# Patient Record
Sex: Female | Born: 1975 | Race: Black or African American | Hispanic: No | Smoking: Never smoker
Health system: Southern US, Community
[De-identification: ages and names within clinical notes are randomized; demographics above are authoritative.]

## PROBLEM LIST (undated history)

## (undated) DIAGNOSIS — R55 Syncope and collapse: Secondary | ICD-10-CM

## (undated) DIAGNOSIS — D573 Sickle-cell trait: Secondary | ICD-10-CM

---

## 1997-10-25 ENCOUNTER — Inpatient Hospital Stay (HOSPITAL_COMMUNITY): Admission: AD | Admit: 1997-10-25 | Discharge: 1997-10-25 | Payer: Self-pay | Admitting: *Deleted

## 1998-05-12 ENCOUNTER — Inpatient Hospital Stay (HOSPITAL_COMMUNITY): Admission: AD | Admit: 1998-05-12 | Discharge: 1998-05-12 | Payer: Self-pay | Admitting: Obstetrics

## 1998-05-22 ENCOUNTER — Emergency Department (HOSPITAL_COMMUNITY): Admission: EM | Admit: 1998-05-22 | Discharge: 1998-05-22 | Payer: Self-pay | Admitting: Emergency Medicine

## 1998-05-22 ENCOUNTER — Encounter: Payer: Self-pay | Admitting: Emergency Medicine

## 1998-06-07 ENCOUNTER — Ambulatory Visit (HOSPITAL_COMMUNITY): Admission: RE | Admit: 1998-06-07 | Discharge: 1998-06-07 | Payer: Self-pay | Admitting: Obstetrics

## 1998-07-17 ENCOUNTER — Inpatient Hospital Stay (HOSPITAL_COMMUNITY): Admission: AD | Admit: 1998-07-17 | Discharge: 1998-07-17 | Payer: Self-pay | Admitting: *Deleted

## 1998-08-18 ENCOUNTER — Ambulatory Visit (HOSPITAL_COMMUNITY): Admission: RE | Admit: 1998-08-18 | Discharge: 1998-08-18 | Payer: Self-pay | Admitting: *Deleted

## 1998-10-13 ENCOUNTER — Ambulatory Visit (HOSPITAL_COMMUNITY): Admission: RE | Admit: 1998-10-13 | Discharge: 1998-10-13 | Payer: Self-pay | Admitting: *Deleted

## 1998-10-13 ENCOUNTER — Encounter: Payer: Self-pay | Admitting: *Deleted

## 1998-11-28 ENCOUNTER — Inpatient Hospital Stay (HOSPITAL_COMMUNITY): Admission: AD | Admit: 1998-11-28 | Discharge: 1998-11-28 | Payer: Self-pay | Admitting: *Deleted

## 1998-12-05 ENCOUNTER — Inpatient Hospital Stay (HOSPITAL_COMMUNITY): Admission: AD | Admit: 1998-12-05 | Discharge: 1998-12-07 | Payer: Self-pay | Admitting: *Deleted

## 1998-12-12 ENCOUNTER — Inpatient Hospital Stay (HOSPITAL_COMMUNITY): Admission: AD | Admit: 1998-12-12 | Discharge: 1998-12-13 | Payer: Self-pay | Admitting: *Deleted

## 1999-11-10 ENCOUNTER — Inpatient Hospital Stay (HOSPITAL_COMMUNITY): Admission: EM | Admit: 1999-11-10 | Discharge: 1999-11-10 | Payer: Self-pay | Admitting: Obstetrics & Gynecology

## 1999-11-24 ENCOUNTER — Emergency Department (HOSPITAL_COMMUNITY): Admission: EM | Admit: 1999-11-24 | Discharge: 1999-11-24 | Payer: Self-pay | Admitting: Emergency Medicine

## 1999-12-18 ENCOUNTER — Inpatient Hospital Stay (HOSPITAL_COMMUNITY): Admission: AD | Admit: 1999-12-18 | Discharge: 1999-12-18 | Payer: Self-pay | Admitting: *Deleted

## 1999-12-28 ENCOUNTER — Inpatient Hospital Stay (HOSPITAL_COMMUNITY): Admission: AD | Admit: 1999-12-28 | Discharge: 1999-12-28 | Payer: Self-pay | Admitting: *Deleted

## 2000-03-21 ENCOUNTER — Inpatient Hospital Stay (HOSPITAL_COMMUNITY): Admission: AD | Admit: 2000-03-21 | Discharge: 2000-03-21 | Payer: Self-pay | Admitting: *Deleted

## 2000-04-09 ENCOUNTER — Inpatient Hospital Stay (HOSPITAL_COMMUNITY): Admission: AD | Admit: 2000-04-09 | Discharge: 2000-04-09 | Payer: Self-pay | Admitting: *Deleted

## 2000-04-26 ENCOUNTER — Encounter: Payer: Self-pay | Admitting: *Deleted

## 2000-04-26 ENCOUNTER — Ambulatory Visit (HOSPITAL_COMMUNITY): Admission: RE | Admit: 2000-04-26 | Discharge: 2000-04-26 | Payer: Self-pay | Admitting: *Deleted

## 2000-05-12 ENCOUNTER — Inpatient Hospital Stay (HOSPITAL_COMMUNITY): Admission: AD | Admit: 2000-05-12 | Discharge: 2000-05-12 | Payer: Self-pay | Admitting: *Deleted

## 2000-06-14 ENCOUNTER — Ambulatory Visit (HOSPITAL_COMMUNITY): Admission: RE | Admit: 2000-06-14 | Discharge: 2000-06-14 | Payer: Self-pay | Admitting: *Deleted

## 2000-06-14 ENCOUNTER — Encounter: Payer: Self-pay | Admitting: *Deleted

## 2000-09-26 ENCOUNTER — Ambulatory Visit (HOSPITAL_COMMUNITY): Admission: RE | Admit: 2000-09-26 | Discharge: 2000-09-26 | Payer: Self-pay | Admitting: Obstetrics

## 2000-09-26 ENCOUNTER — Encounter: Payer: Self-pay | Admitting: *Deleted

## 2000-10-21 ENCOUNTER — Inpatient Hospital Stay (HOSPITAL_COMMUNITY): Admission: AD | Admit: 2000-10-21 | Discharge: 2000-10-24 | Payer: Self-pay | Admitting: *Deleted

## 2001-01-31 ENCOUNTER — Inpatient Hospital Stay (HOSPITAL_COMMUNITY): Admission: AD | Admit: 2001-01-31 | Discharge: 2001-01-31 | Payer: Self-pay | Admitting: *Deleted

## 2001-02-11 ENCOUNTER — Other Ambulatory Visit: Admission: RE | Admit: 2001-02-11 | Discharge: 2001-02-11 | Payer: Self-pay | Admitting: Obstetrics and Gynecology

## 2001-03-06 ENCOUNTER — Inpatient Hospital Stay (HOSPITAL_COMMUNITY): Admission: AD | Admit: 2001-03-06 | Discharge: 2001-03-06 | Payer: Self-pay | Admitting: Obstetrics

## 2002-12-10 ENCOUNTER — Encounter: Payer: Self-pay | Admitting: Obstetrics and Gynecology

## 2002-12-10 ENCOUNTER — Inpatient Hospital Stay (HOSPITAL_COMMUNITY): Admission: AD | Admit: 2002-12-10 | Discharge: 2002-12-10 | Payer: Self-pay | Admitting: Obstetrics and Gynecology

## 2002-12-19 ENCOUNTER — Other Ambulatory Visit: Admission: RE | Admit: 2002-12-19 | Discharge: 2002-12-19 | Payer: Self-pay | Admitting: Obstetrics and Gynecology

## 2010-03-07 ENCOUNTER — Emergency Department (HOSPITAL_COMMUNITY): Admission: EM | Admit: 2010-03-07 | Discharge: 2010-03-08 | Payer: Self-pay | Admitting: Emergency Medicine

## 2010-09-23 LAB — URINALYSIS, ROUTINE W REFLEX MICROSCOPIC
Bilirubin Urine: NEGATIVE
Glucose, UA: NEGATIVE mg/dL
Ketones, ur: NEGATIVE mg/dL
Nitrite: POSITIVE — AB
Protein, ur: 30 mg/dL — AB
Specific Gravity, Urine: 1.009 (ref 1.005–1.030)
Urobilinogen, UA: 1 mg/dL (ref 0.0–1.0)
pH: 6 (ref 5.0–8.0)

## 2010-09-23 LAB — CBC
HCT: 34.8 % — ABNORMAL LOW (ref 36.0–46.0)
Hemoglobin: 11.2 g/dL — ABNORMAL LOW (ref 12.0–15.0)
MCH: 26.2 pg (ref 26.0–34.0)
MCHC: 32.2 g/dL (ref 30.0–36.0)
MCV: 81.5 fL (ref 78.0–100.0)
Platelets: 256 10*3/uL (ref 150–400)
RBC: 4.27 MIL/uL (ref 3.87–5.11)
RDW: 14.2 % (ref 11.5–15.5)
WBC: 9.1 10*3/uL (ref 4.0–10.5)

## 2010-09-23 LAB — DIFFERENTIAL
Basophils Absolute: 0 10*3/uL (ref 0.0–0.1)
Basophils Relative: 0 % (ref 0–1)
Eosinophils Absolute: 0.1 10*3/uL (ref 0.0–0.7)
Eosinophils Relative: 1 % (ref 0–5)
Lymphocytes Relative: 18 % (ref 12–46)
Lymphs Abs: 1.7 K/uL (ref 0.7–4.0)
Monocytes Absolute: 0.7 K/uL (ref 0.1–1.0)
Monocytes Relative: 7 % (ref 3–12)
Neutro Abs: 6.6 K/uL (ref 1.7–7.7)
Neutrophils Relative %: 73 % (ref 43–77)

## 2010-09-23 LAB — BASIC METABOLIC PANEL WITH GFR
Calcium: 9 mg/dL (ref 8.4–10.5)
GFR calc Af Amer: >60 mL/min (ref >60)
GFR calc non Af Amer: >60 mL/min (ref >60)
Glucose, Bld: 95 mg/dL (ref 70–99)
Potassium: 3.7 meq/L (ref 3.5–5.1)
Sodium: 138 meq/L (ref 135–145)

## 2010-09-23 LAB — WET PREP, GENITAL: Yeast Wet Prep HPF POC: NONE SEEN

## 2010-09-23 LAB — URINE CULTURE
Colony Count: >=100000
Culture  Setup Time: 201108292225

## 2010-09-23 LAB — BASIC METABOLIC PANEL
BUN: 9 mg/dL (ref 6–23)
CO2: 25 mEq/L (ref 19–32)
Chloride: 107 mEq/L (ref 96–112)
Creatinine, Ser: 0.83 mg/dL (ref 0.4–1.2)

## 2010-09-23 LAB — GC/CHLAMYDIA PROBE AMP, GENITAL
Chlamydia, DNA Probe: NEGATIVE
GC Probe Amp, Genital: NEGATIVE

## 2010-09-23 LAB — POCT PREGNANCY, URINE: Preg Test, Ur: NEGATIVE

## 2010-09-23 LAB — URINE MICROSCOPIC-ADD ON

## 2010-12-23 ENCOUNTER — Inpatient Hospital Stay (HOSPITAL_COMMUNITY)
Admission: AD | Admit: 2010-12-23 | Discharge: 2010-12-23 | Disposition: A | Discharge status: Home / Self Care | Payer: Self-pay | Source: Ambulatory Visit | Attending: Obstetrics & Gynecology | Admitting: Obstetrics & Gynecology

## 2010-12-23 DIAGNOSIS — A5901 Trichomonal vulvovaginitis: Secondary | ICD-10-CM

## 2010-12-23 DIAGNOSIS — R109 Unspecified abdominal pain: Secondary | ICD-10-CM | POA: Insufficient documentation

## 2010-12-23 DIAGNOSIS — N739 Female pelvic inflammatory disease, unspecified: Secondary | ICD-10-CM

## 2010-12-23 LAB — CBC
Platelets: 280 10*3/uL (ref 150–400)
RBC: 4.47 MIL/uL (ref 3.87–5.11)
WBC: 6.9 10*3/uL (ref 4.0–10.5)

## 2010-12-23 LAB — WET PREP, GENITAL

## 2010-12-23 LAB — HCG, QUANTITATIVE, PREGNANCY: hCG, Beta Chain, Quant, S: 1 m[IU]/mL (ref <5)

## 2010-12-24 LAB — GC/CHLAMYDIA PROBE AMP, GENITAL: Chlamydia, DNA Probe: NEGATIVE

## 2010-12-27 ENCOUNTER — Emergency Department (HOSPITAL_COMMUNITY)
Admission: EM | Admit: 2010-12-27 | Discharge: 2010-12-27 | Disposition: A | Discharge status: Home / Self Care | Payer: Self-pay | Attending: Emergency Medicine | Admitting: Emergency Medicine

## 2010-12-27 ENCOUNTER — Emergency Department (HOSPITAL_COMMUNITY): Payer: Self-pay

## 2010-12-27 DIAGNOSIS — R55 Syncope and collapse: Secondary | ICD-10-CM | POA: Insufficient documentation

## 2010-12-27 DIAGNOSIS — R51 Headache: Secondary | ICD-10-CM | POA: Insufficient documentation

## 2010-12-27 LAB — RAPID URINE DRUG SCREEN, HOSP PERFORMED
Amphetamines: NOT DETECTED
Barbiturates: NOT DETECTED
Cocaine: NOT DETECTED
Tetrahydrocannabinol: NOT DETECTED

## 2010-12-27 LAB — ETHANOL: Alcohol, Ethyl (B): <11 mg/dL (ref 0–11)

## 2010-12-27 LAB — URINALYSIS, ROUTINE W REFLEX MICROSCOPIC
Bilirubin Urine: NEGATIVE
Ketones, ur: NEGATIVE mg/dL
Nitrite: NEGATIVE
pH: 5.5 (ref 5.0–8.0)

## 2011-08-29 ENCOUNTER — Emergency Department (HOSPITAL_COMMUNITY)
Admission: EM | Admit: 2011-08-29 | Discharge: 2011-08-29 | Disposition: A | Discharge status: Home / Self Care | Payer: Self-pay | Attending: Emergency Medicine | Admitting: Emergency Medicine

## 2011-08-29 ENCOUNTER — Encounter (HOSPITAL_COMMUNITY): Payer: Self-pay | Admitting: Emergency Medicine

## 2011-08-29 DIAGNOSIS — G44009 Cluster headache syndrome, unspecified, not intractable: Secondary | ICD-10-CM | POA: Insufficient documentation

## 2011-08-29 DIAGNOSIS — D573 Sickle-cell trait: Secondary | ICD-10-CM | POA: Insufficient documentation

## 2011-08-29 DIAGNOSIS — H53149 Visual discomfort, unspecified: Secondary | ICD-10-CM | POA: Insufficient documentation

## 2011-08-29 DIAGNOSIS — R110 Nausea: Secondary | ICD-10-CM | POA: Insufficient documentation

## 2011-08-29 HISTORY — DX: Sickle-cell trait: D57.3

## 2011-08-29 HISTORY — DX: Syncope and collapse: R55

## 2011-08-29 MED ORDER — METOCLOPRAMIDE HCL 5 MG/ML IJ SOLN
10.0000 mg | Freq: Once | INTRAMUSCULAR | Status: AC
  Administered 2011-08-29: 10 mg via INTRAVENOUS
  Filled 2011-08-29: qty 2

## 2011-08-29 MED ORDER — DEXAMETHASONE SODIUM PHOSPHATE 10 MG/ML IJ SOLN
10.0000 mg | Freq: Once | INTRAMUSCULAR | Status: AC
  Administered 2011-08-29: 10 mg via INTRAVENOUS
  Filled 2011-08-29: qty 1

## 2011-08-29 MED ORDER — SODIUM CHLORIDE 0.9 % IV BOLUS (SEPSIS)
1000.0000 mL | Freq: Once | INTRAVENOUS | Status: AC
  Administered 2011-08-29: 1000 mL via INTRAVENOUS

## 2011-08-29 MED ORDER — DIPHENHYDRAMINE HCL 50 MG/ML IJ SOLN
25.0000 mg | Freq: Once | INTRAMUSCULAR | Status: AC
  Administered 2011-08-29: 12:00:00 via INTRAVENOUS
  Filled 2011-08-29: qty 1

## 2011-08-29 NOTE — Discharge Instructions (Signed)
Cluster Headache Cluster headaches are recognized by their pattern of deep, intense head pain. They normally occur on one side of the head. Typically, cluster headaches:  Are severe in nature.   Occur repeatedly over weeks to months at a time and are then followed by periods of no headaches.   Can last 15 minutes to 3 hours.   Occur at the same time each day, often at night.   Occur several times a day.  CAUSES The exact cause of a cluster headache is not known. Some things can trigger a cluster headache, such as:  Smoking.   Alcohol use.   Lack of sleep.   High altitude travel, such as airline travel.   Change of seasons.   Certain foods, such as foods or drinks that contain nitrates, glutamate, aspartame, or tyramine.  SYMPTOMS   Severe pain that begins in or around the eye or temple.   One-sided head pain.   Feeling sick to your stomach (nauseous).   Sensitivity to light.   Runny nose.   Eye redness, tearing, and nasal stuffiness on the side of your head where you are experiencing pain.   Sweaty, pale skin of the face.   Droopy eyelid.  DIAGNOSIS  Cluster headaches are diagnosed based on symptoms and physical exam. Your caregiver may order a computerized X-ray scan (CT or CAT scan) or a computerized magnetic scan (MRI) of your head or other lab tests to see if your headaches are caused from other medical conditions.  TREATMENT   Medications for pain relief and to prevent recurrent attacks.   Oxygen for pain relief.   Biofeedback programs may help reduce headache pain.  Some people may need a combination of medicines for cluster headaches. It may be helpful to keep a headache diary. This may help you find a trend for what is triggering your headaches. Your caregiver can develop a treatment plan that can be helpful to you.  HOME CARE INSTRUCTIONS  During cluster periods:  Follow a regular sleep schedule.Do not vary the amount and time that you sleep from day  to day. It is important to stay on the same schedule during a cluster period to help prevent headaches.   Avoid alcohol.   Stop smoking.  SEEK IMMEDIATE MEDICAL CARE IF:   You have any changes from your previous cluster headaches either in intensity or frequency.   You are not getting relief from medicines you are taking.   You faint.   You develop weakness or numbness, especially on one side of your body or face.   You develop double vision.   You develop nausea or vomiting.   You cannot keep your balance or have difficulty talking or walking.   You develop neck pain or neck stiffness.   You have a fever.  Document Released: 06/26/2005 Document Revised: 03/08/2011 Document Reviewed: 09/24/2010 [facility] Patient Information 2012 [place], [place].Migraine Headache A migraine headache is an intense, throbbing pain on one or both sides of your head. The exact cause of a migraine headache is not always known. A migraine may be caused when nerves in the brain become irritated and release chemicals that cause swelling within blood vessels, causing pain. Many migraine sufferers have a family history of migraines. Before you get a migraine you may or may not get an aura. An aura is a group of symptoms that can predict the beginning of a migraine. An aura may include:  Visual changes such as:   Flashing lights.  Bright spots or zig-zag lines.   Tunnel vision.   Feelings of numbness.   Trouble talking.   Muscle weakness.  SYMPTOMS  Pain on one or both sides of your head.   Pain that is pulsating or throbbing in nature.   Pain that is severe enough to prevent daily activities.   Pain that is aggravated by any daily physical activity.   Nausea (feeling sick to your stomach), vomiting, or both.   Pain with exposure to bright lights, loud noises, or activity.   General sensitivity to bright lights or loud noises.  MIGRAINE TRIGGERS Examples of triggers of migraine headaches  include:   Alcohol.   Smoking.   Stress.   It may be related to menses (female menstruation).   Aged cheeses.   Foods or drinks that contain nitrates, glutamate, aspartame, or tyramine.   Lack of sleep.   Chocolate.   Caffeine.   Hunger.   Medications such as nitroglycerine (used to treat chest pain), birth control pills, estrogen, and some blood pressure medications.  DIAGNOSIS  A migraine headache is often diagnosed based on:  Symptoms.   Physical examination.   A computerized X-ray scan (computed tomography, CT) of your head.  TREATMENT  Medications can help prevent migraines if they are recurrent or should they become recurrent. Your caregiver can help you with a medication or treatment program that will be helpful to you.   Lying down in a dark, quiet room may be helpful.   Keeping a headache diary may help you find a trend as to what may be triggering your headaches.  SEEK IMMEDIATE MEDICAL CARE IF:   You have confusion, personality changes or seizures.   You have headaches that wake you from sleep.   You have an increased frequency in your headaches.   You have a stiff neck.   You have a loss of vision.   You have muscle weakness.   You start losing your balance or have trouble walking.   You feel faint or pass out.  MAKE SURE YOU:   Understand these instructions.   Will watch your condition.   Will get help right away if you are not doing well or get worse.  Document Released: 06/26/2005 Document Revised: 03/08/2011 Document Reviewed: 02/09/2009 [facility] Patient Information 2012 [place], [place].

## 2011-08-29 NOTE — ED Notes (Signed)
Pt from home c/o constant headache and intermittent nausea and sharp epigastric pain for about a week. Pt denies vomiting and diarrhea or injury.

## 2011-08-29 NOTE — ED Notes (Signed)
MD at bedside. 

## 2011-08-29 NOTE — ED Provider Notes (Signed)
History     CSN: [id number]  Arrival date & time 08/29/11  1038   First MD Initiated Contact with Patient 08/29/11 1044      Chief Complaint  Patient presents with  . Headache    (Consider location/radiation/quality/duration/timing/severity/associated sxs/prior treatment) HPI Patient is a 36 year old female who presents today complaining of headache for the past week. Patient has history of prior headaches. She describes bilateral frontal discomfort. She denies any nasal congestion, neck pain, fevers, rash, or neurologic symptoms. She does describe some mild photophobia. Patient has tried [facility] powders, ibuprofen, and Tylenol without relief. She endorses nausea but no vomiting. Nothing has made her symptoms better and made has made them worse. There are no other associated or modifying factors.  Past Medical History  Diagnosis Date  . Syncope   . Sickle cell trait     Past Surgical History  Procedure Date  . Cesarean section     History reviewed. No pertinent family history.  History  Substance Use Topics  . Smoking status: Never Smoker   . Smokeless tobacco: Never Used  . Alcohol Use: Yes     socially    OB History    Grav Para Term Preterm Abortions TAB SAB Ect Mult Living                  Review of Systems  Constitutional: Negative.   Eyes: Positive for photophobia.  Respiratory: Negative.   Cardiovascular: Negative.   Gastrointestinal: Negative.   Genitourinary: Negative.   Musculoskeletal: Negative.   Skin: Negative.   Neurological: Positive for headaches.  Hematological: Negative.   Psychiatric/Behavioral: Negative.   All other systems reviewed and are negative.    Allergies  Review of patient's allergies indicates no known allergies.  Home Medications   Current Outpatient Rx  Name Route Sig Dispense Refill  . BC HEADACHE POWDER PO Oral Take by mouth every 4 (four) hours as needed.    . IBUPROFEN 200 MG PO TABS Oral Take 400 mg by mouth every 6  (six) hours as needed. For pain/fever      BP 110/67  Pulse 81  Temp(Src) 99.1 F (37.3 C) (Oral)  Resp 18  Wt 189 lb (85.73 kg)  SpO2 100%  LMP 08/06/2011  Physical Exam  Nursing note and vitals reviewed. GEN: Well-developed, well-nourished female in no distress HEENT: Atraumatic, normocephalic. Oropharynx clear without erythema EYES: PERRLA BL, no scleral icterus. NECK: Trachea midline, no meningismus CV: regular rate and rhythm. No murmurs, rubs, or gallops PULM: No respiratory distress.  No crackles, wheezes, or rales. GI: soft, non-tender. No guarding, rebound, or tenderness. + bowel sounds  Neuro: cranial nerves 2-12 intact, no abnormalities of strength or sensation, A and O x 3 MSK: Patient moves all 4 extremities symmetrically, no deformity, edema, or injury noted Psych: no abnormality of mood   ED Course  Procedures (including critical care time)   Labs Reviewed  POCT PREGNANCY, URINE   No results found.   1. Cluster headache       MDM  Patient was evaluated by myself. Based on evaluation patient was treated with Reglan and Benadryl as well as IV fluids. Patient did have a pregnancy test that she was uncertain whether she could be pregnant. This is negative. Patient had complete resolution of her headache. She was given a dose of Decadron to prevent recurrence. Patient was discharged in good condition.        Cyndra Numbers, MD 08/29/11 [phone]

## 2012-02-15 ENCOUNTER — Emergency Department (HOSPITAL_COMMUNITY)
Admission: EM | Admit: 2012-02-15 | Discharge: 2012-02-15 | Disposition: A | Discharge status: Home / Self Care | Payer: Medicaid Other | Attending: Emergency Medicine | Admitting: Emergency Medicine

## 2012-02-15 ENCOUNTER — Emergency Department (HOSPITAL_COMMUNITY): Payer: Medicaid Other

## 2012-02-15 ENCOUNTER — Encounter (HOSPITAL_COMMUNITY): Payer: Self-pay | Admitting: Adult Health

## 2012-02-15 ENCOUNTER — Other Ambulatory Visit: Payer: Self-pay

## 2012-02-15 DIAGNOSIS — R0602 Shortness of breath: Secondary | ICD-10-CM | POA: Insufficient documentation

## 2012-02-15 DIAGNOSIS — R079 Chest pain, unspecified: Secondary | ICD-10-CM | POA: Insufficient documentation

## 2012-02-15 LAB — BASIC METABOLIC PANEL
GFR calc Af Amer: >90 mL/min (ref >90)
GFR calc non Af Amer: >90 mL/min (ref >90)
Glucose, Bld: 113 mg/dL — ABNORMAL HIGH (ref 70–99)
Potassium: 3.8 mEq/L (ref 3.5–5.1)
Sodium: 139 mEq/L (ref 135–145)

## 2012-02-15 LAB — POCT I-STAT TROPONIN I

## 2012-02-15 LAB — CBC
Hemoglobin: 12.9 g/dL (ref 12.0–15.0)
MCHC: 32.2 g/dL (ref 30.0–36.0)
WBC: 7.7 10*3/uL (ref 4.0–10.5)

## 2012-02-15 NOTE — ED Notes (Addendum)
2 days of sharp right sided chest pain that radiates to epigastric area and back associated with pain with inspiration and SOB pain was intermittent but has become constant today. Denies nausea

## 2012-02-15 NOTE — ED Notes (Signed)
Pt did not answer when called

## 2012-02-15 NOTE — ED Notes (Signed)
Called for pt with no answer 

## 2012-02-15 NOTE — ED Notes (Signed)
Pt called for room and no answer  

## 2012-02-16 ENCOUNTER — Encounter (HOSPITAL_COMMUNITY): Payer: Self-pay | Admitting: *Deleted

## 2012-02-16 ENCOUNTER — Inpatient Hospital Stay (HOSPITAL_COMMUNITY)
Admission: EM | Admit: 2012-02-16 | Discharge: 2012-02-18 | DRG: 419 | Disposition: A | Discharge status: Home / Self Care | Payer: Medicaid Other | Attending: Surgery | Admitting: Surgery

## 2012-02-16 ENCOUNTER — Emergency Department (HOSPITAL_COMMUNITY): Payer: Medicaid Other

## 2012-02-16 DIAGNOSIS — D573 Sickle-cell trait: Secondary | ICD-10-CM | POA: Diagnosis present

## 2012-02-16 DIAGNOSIS — K810 Acute cholecystitis: Principal | ICD-10-CM | POA: Diagnosis present

## 2012-02-16 DIAGNOSIS — K8010 Calculus of gallbladder with chronic cholecystitis without obstruction: Secondary | ICD-10-CM

## 2012-02-16 DIAGNOSIS — K819 Cholecystitis, unspecified: Secondary | ICD-10-CM

## 2012-02-16 LAB — CBC WITH DIFFERENTIAL/PLATELET
Basophils Absolute: 0.1 10*3/uL (ref 0.0–0.1)
HCT: 40.2 % (ref 36.0–46.0)
Lymphocytes Relative: 28 % (ref 12–46)
Neutro Abs: 4.3 10*3/uL (ref 1.7–7.7)
Platelets: 294 10*3/uL (ref 150–400)
RDW: 14.8 % (ref 11.5–15.5)
WBC: 6.9 10*3/uL (ref 4.0–10.5)

## 2012-02-16 LAB — COMPREHENSIVE METABOLIC PANEL
ALT: 491 U/L — ABNORMAL HIGH (ref 0–35)
AST: 292 U/L — ABNORMAL HIGH (ref 0–37)
CO2: 23 mEq/L (ref 19–32)
Chloride: 104 mEq/L (ref 96–112)
GFR calc non Af Amer: >90 mL/min (ref >90)
Potassium: 3.7 mEq/L (ref 3.5–5.1)
Sodium: 139 mEq/L (ref 135–145)
Total Bilirubin: 0.9 mg/dL (ref 0.3–1.2)

## 2012-02-16 LAB — URINALYSIS, ROUTINE W REFLEX MICROSCOPIC
Hgb urine dipstick: NEGATIVE
Protein, ur: NEGATIVE mg/dL
Urobilinogen, UA: 1 mg/dL (ref 0.0–1.0)

## 2012-02-16 LAB — LIPASE, BLOOD: Lipase: 32 U/L (ref 11–59)

## 2012-02-16 MED ORDER — ACETAMINOPHEN 650 MG RE SUPP: 650.0000 mg | Freq: Four times a day (QID) | RECTAL | Status: DC | PRN

## 2012-02-16 MED ORDER — HYDROMORPHONE HCL PF 1 MG/ML IJ SOLN
1.0000 mg | INTRAMUSCULAR | Status: DC | PRN
  Administered 2012-02-17 (×2): 1 mg via INTRAVENOUS
  Filled 2012-02-16 (×3): qty 1

## 2012-02-16 MED ORDER — OXYCODONE-ACETAMINOPHEN 5-325 MG PO TABS
2.0000 | ORAL_TABLET | Freq: Once | ORAL | Status: AC
  Administered 2012-02-16: 2 via ORAL
  Filled 2012-02-16: qty 2

## 2012-02-16 MED ORDER — POTASSIUM CHLORIDE IN NACL 20-0.9 MEQ/L-% IV SOLN
INTRAVENOUS | Status: DC
  Administered 2012-02-16: 23:00:00 via INTRAVENOUS
  Filled 2012-02-16 (×3): qty 1000

## 2012-02-16 MED ORDER — ONDANSETRON HCL 4 MG/2ML IJ SOLN: 4.0000 mg | Freq: Four times a day (QID) | INTRAMUSCULAR | Status: DC | PRN

## 2012-02-16 MED ORDER — PANTOPRAZOLE SODIUM 40 MG IV SOLR
40.0000 mg | Freq: Every day | INTRAVENOUS | Status: DC
  Administered 2012-02-16: 40 mg via INTRAVENOUS
  Filled 2012-02-16 (×2): qty 40

## 2012-02-16 MED ORDER — SODIUM CHLORIDE 0.9 % IV SOLN
3.0000 g | Freq: Four times a day (QID) | INTRAVENOUS | Status: DC
  Administered 2012-02-16 – 2012-02-17 (×3): 3 g via INTRAVENOUS
  Filled 2012-02-16 (×5): qty 3

## 2012-02-16 MED ORDER — ACETAMINOPHEN 325 MG PO TABS
650.0000 mg | ORAL_TABLET | Freq: Four times a day (QID) | ORAL | Status: DC | PRN
  Administered 2012-02-18: 650 mg via ORAL
  Filled 2012-02-16: qty 2

## 2012-02-16 MED ORDER — ONDANSETRON 4 MG PO TBDP
4.0000 mg | ORAL_TABLET | Freq: Once | ORAL | Status: AC
  Administered 2012-02-16: 4 mg via ORAL
  Filled 2012-02-16: qty 1

## 2012-02-16 NOTE — ED Provider Notes (Signed)
Medical screening examination/treatment/procedure(s) were conducted as a shared visit with non-physician practitioner(s) and myself.  I personally evaluated the patient during the encounter  See MD note.   Richardean Canal, MD 02/16/12 [phone]

## 2012-02-16 NOTE — ED Notes (Signed)
X 3 days of substernal cp - intermittent. Having x 3 days of n/v. Unrelieved by tums or aspiring. Tried to eat a bowl of cereal this morning.

## 2012-02-16 NOTE — H&P (Signed)
Julia Arellano is an 36 y.o. female.   Chief Complaint: RUQ abdominal pain HPI: 36 yo female in good health presents with several days of epigastric/ RUQ abdominal pain with radiation through to her back.  She has had milder episodes over the last few months, usually related to eating "heavy foods".  No nausea, vomiting, diarrhea, or fever.  Past Medical History  Diagnosis Date  . Syncope   . Sickle cell trait     Past Surgical History  Procedure Date  . Cesarean section     No family history on file. Social History:  reports that she has never smoked. She has never used smokeless tobacco. She reports that she drinks alcohol. She reports that she does not use illicit drugs.  Allergies: NKDA Meds:  Aspirin PRN Tums PRN   (Not in a hospital admission)  Results for orders placed during the hospital encounter of 02/16/12 (from the past 48 hour(s))  URINALYSIS, ROUTINE W REFLEX MICROSCOPIC     Status: Abnormal   Collection Time   02/16/12 12:05 PM      Component Value Range Comment   Color, Urine YELLOW  YELLOW    APPearance CLEAR  CLEAR    Specific Gravity, Urine 1.028  1.005 - 1.030    pH 5.5  5.0 - 8.0    Glucose, UA NEGATIVE  NEGATIVE mg/dL    Hgb urine dipstick NEGATIVE  NEGATIVE    Bilirubin Urine SMALL (*) NEGATIVE    Ketones, ur 15 (*) NEGATIVE mg/dL    Protein, ur NEGATIVE  NEGATIVE mg/dL    Urobilinogen, UA 1.0  0.0 - 1.0 mg/dL    Nitrite NEGATIVE  NEGATIVE    Leukocytes, UA NEGATIVE  NEGATIVE MICROSCOPIC NOT DONE ON URINES WITH NEGATIVE PROTEIN, BLOOD, LEUKOCYTES, NITRITE, OR GLUCOSE <1000 mg/dL.  PREGNANCY, URINE     Status: Normal   Collection Time   02/16/12 12:05 PM      Component Value Range Comment   Preg Test, Ur NEGATIVE  NEGATIVE   CBC WITH DIFFERENTIAL     Status: Normal   Collection Time   02/16/12 12:13 PM      Component Value Range Comment   WBC 6.9  4.0 - 10.5 K/uL    RBC 4.96  3.87 - 5.11 MIL/uL    Hemoglobin 12.9  12.0 - 15.0 g/dL    HCT 16.1   09.6 - 04.5 %    MCV 81.0  78.0 - 100.0 fL    MCH 26.0  26.0 - 34.0 pg    MCHC 32.1  30.0 - 36.0 g/dL    RDW 40.9  81.1 - 91.4 %    Platelets 294  150 - 400 K/uL    Neutrophils Relative 62  43 - 77 %    Neutro Abs 4.3  1.7 - 7.7 K/uL    Lymphocytes Relative 28  12 - 46 %    Lymphs Abs 1.9  0.7 - 4.0 K/uL    Monocytes Relative 8  3 - 12 %    Monocytes Absolute 0.5  0.1 - 1.0 K/uL    Eosinophils Relative 1  0 - 5 %    Eosinophils Absolute 0.1  0.0 - 0.7 K/uL    Basophils Relative 1  0 - 1 %    Basophils Absolute 0.1  0.0 - 0.1 K/uL   COMPREHENSIVE METABOLIC PANEL     Status: Abnormal   Collection Time   02/16/12 12:13 PM      Component  Value Range Comment   Sodium 139  135 - 145 mEq/L    Potassium 3.7  3.5 - 5.1 mEq/L    Chloride 104  96 - 112 mEq/L    CO2 23  19 - 32 mEq/L    Glucose, Bld 136 (*) 70 - 99 mg/dL    BUN 13  6 - 23 mg/dL    Creatinine, Ser 1.61  0.50 - 1.10 mg/dL    Calcium 09.6  8.4 - 10.5 mg/dL    Total Protein 8.1  6.0 - 8.3 g/dL    Albumin 4.1  3.5 - 5.2 g/dL    AST 045 (*) 0 - 37 U/L    ALT 491 (*) 0 - 35 U/L    Alkaline Phosphatase 245 (*) 39 - 117 U/L    Total Bilirubin 0.9  0.3 - 1.2 mg/dL    GFR calc non Af Amer >90  >90 mL/min    GFR calc Af Amer >90  >90 mL/min   LIPASE, BLOOD     Status: Normal   Collection Time   02/16/12 12:13 PM      Component Value Range Comment   Lipase 32  11 - 59 U/L   POCT I-STAT TROPONIN I     Status: Normal   Collection Time   02/16/12  2:30 PM      Component Value Range Comment   Troponin i, poc 0.00  0.00 - 0.08 ng/mL    Comment 3             Dg Chest 2 View  02/15/2012  *RADIOLOGY REPORT*  Clinical Data: Chest pain.Sickle cell trait.  CHEST - 2 VIEW  Comparison: None.  Findings: Cardiac and mediastinal contours appear normal.  The lungs appear clear.  No pleural effusion is identified.  IMPRESSION:  No significant abnormality identified.  Original Report Authenticated By: Dellia Cloud, M.D.   US Abdomen  Complete  02/16/2012  *RADIOLOGY REPORT*  Clinical Data:  1-week history of epigastric abdominal pain which is worse after edema.  COMPLETE ABDOMINAL ULTRASOUND  Comparison:  None.  Findings:  Gallbladder:  Numerous shadowing gallstones within a contracted gallbladder.  Gallbladder wall thickening up to approximately 6 mm. No pericholecystic fluid.  Negative sonographic Murphy's sign according to the ultrasound technologist.  Common bile duct:  Dilated up to approximately 8 mm in diameter. No visible common bile duct stone, though the distal duct was obscured by duodenal bowel gas.  Liver:  Normal size and echotexture without focal parenchymal abnormality.  Patent portal vein with hepatopetal flow.  IVC:  Patent.  Pancreas:  Although the pancreas is difficult to visualize in its entirety, no focal pancreatic abnormality is identified.  The head was obscured by duodenal bowel gas.  Spleen:  Normal size and echotexture without focal parenchymal abnormality.  Right Kidney:  No hydronephrosis.  Well-preserved cortex.  No shadowing calculi.  Normal size and parenchymal echotexture without focal abnormalities.  Approximately 11.1 cm in length.  Left Kidney:  No hydronephrosis.  Well-preserved cortex.  No shadowing calculi.  Normal size and parenchymal echotexture without focal abnormalities.  Approximately 10.4 cm in length.  Abdominal aorta:  Normal in caliber throughout its visualized course in the abdomen without significant atherosclerosis.  IMPRESSION:  1.  Cholelithiasis.  Contracted gallbladder with gallbladder wall thickening.  Negative sonographic Murphy's sign. 2.  Mild biliary ductal dilation up to 8 mm.  No visible bile duct stone, though the distal duct was obscured by duodenal bowel gas  and was therefore not evaluated. 3.  Otherwise normal examination with a caveat that the pancreatic head was also obscured by duodenal bowel gas and was not evaluated.  Original Report Authenticated By: Arnell Sieving,  M.D.    Review of Systems  Eyes: Negative.   Gastrointestinal: Positive for abdominal pain.  Neurological: Negative.   Endo/Heme/Allergies: Negative.     Blood pressure 92/55, pulse 68, temperature 99.2 F (37.3 C), temperature source Oral, resp. rate 17, last menstrual period 02/02/2012, SpO2 98.00%. Physical Exam  WDWN in NAD HEENT:  EOMI, sclera anicteric Neck:  No masses, no thyromegaly Lungs:  CTA bilaterally; normal respiratory effort CV:  Regular rate and rhythm; no murmurs Abd:  +bowel sounds, soft, tender in epigastrium/ RUQ with radiation to the right flank; no palpable masses Ext:  Well-perfused; no edema Skin:  Warm, dry; no sign of jaundice  Assessment/Plan Acute cholecystitis - possible choledocholithiasis with dilated CBD, but bilirubin is normal.  Plan:  Admission tonight for IV antibiotics, hydration. Laparoscopic cholecystectomy with intraoperative cholangiogram tomorrow.  The surgical procedure has been discussed with the patient.  Potential risks, benefits, alternative treatments, and expected outcomes have been explained.  All of the patient's questions at this time have been answered.  The likelihood of reaching the patient's treatment goal is good.  The patient understand the proposed surgical procedure and wishes to proceed.   [place] Jr K. 02/16/2012, 6:02 PM

## 2012-02-16 NOTE — ED Notes (Signed)
Patient presented to the ER with complaint of epigastric pain radiating to the back worse after she eats. Patient also stated that making herself vomits makes the pain better.

## 2012-02-16 NOTE — ED Provider Notes (Signed)
History     CSN: [id number]  Arrival date & time 02/16/12  1134   First MD Initiated Contact with Patient 02/16/12 1350      Chief Complaint  Patient presents with  . Chest Pain  . Abdominal Pain    (Consider location/radiation/quality/duration/timing/severity/associated sxs/prior treatment) The history is provided by the patient and medical records.   Julia Arellano is a 36 y.o. female presents to the emergency department complaining of epigastric pain x 1 week.  The pain is sharp in nature, radiates to the back and rated at 10/10.  It becomes worse about 1 hr after eating and is only relieved if she makes her self vomit.  She does not spontaneously vomit. Emesis is partially digested and non bilious The onset of the symptoms was  abrupt starting 1 week ago.  The patient has associated nausea.  The symptoms have been  intermittent, gradually worsened.  eating makes the symptoms worse and self-induced vomiting makes symptoms better.  The patient denies fever, chills, diaphoresis, diarrhea, chest pain, shortness of breath.  She has tried Tums, gas-x and zantac without relief.  She consumes EtOH approx 1x/month.   The patient has medical history significant for:  Past Medical History  Diagnosis Date  . Syncope   . Sickle cell trait      Past Medical History  Diagnosis Date  . Syncope   . Sickle cell trait     Past Surgical History  Procedure Date  . Cesarean section     No family history on file.  History  Substance Use Topics  . Smoking status: Never Smoker   . Smokeless tobacco: Never Used  . Alcohol Use: Yes     socially    OB History    Grav Para Term Preterm Abortions TAB SAB Ect Mult Living                  Review of Systems  Constitutional: Positive for appetite change (doesn't want to eat 2/2 fear of pain). Negative for fever, diaphoresis, fatigue and unexpected weight change.  HENT: Negative for sore throat, mouth sores and trouble swallowing.   Eyes:  Negative for visual disturbance.  Respiratory: Negative for cough, chest tightness, shortness of breath, wheezing and stridor.   Cardiovascular: Negative for chest pain and palpitations.  Gastrointestinal: Positive for nausea, vomiting and abdominal pain. Negative for diarrhea, constipation, blood in stool, abdominal distention and rectal pain.  Genitourinary: Negative for dysuria, urgency, frequency, hematuria, flank pain and difficulty urinating.  Musculoskeletal: Negative for back pain.  Skin: Negative for rash.  Neurological: Negative for syncope, weakness, light-headedness and headaches.  Psychiatric/Behavioral: Negative for disturbed wake/sleep cycle. The patient is not nervous/anxious.   All other systems reviewed and are negative.    Allergies  Review of patient's allergies indicates no known allergies.  Home Medications   Current Outpatient Rx  Name Route Sig Dispense Refill  . ASPIRIN 325 MG PO TABS Oral Take 325 mg by mouth daily as needed. For pain    . CALCIUM CARBONATE ANTACID 500 MG PO CHEW Oral Chew 4 tablets by mouth 4 (four) times daily as needed. For heartburn      BP 120/84  Pulse 70  Temp 99.2 F (37.3 C) (Oral)  Resp 20  SpO2 99%  LMP 02/02/2012  Physical Exam  Nursing note and vitals reviewed. Constitutional: She appears well-developed and well-nourished.  HENT:  Head: Normocephalic and atraumatic.  Mouth/Throat: Oropharynx is clear and moist.  Eyes: Conjunctivae are  normal. No scleral icterus.  Neck: Normal range of motion. Neck supple.  Cardiovascular: Normal rate, regular rhythm, normal heart sounds and intact distal pulses.  Exam reveals no gallop and no friction rub.   No murmur heard. Pulmonary/Chest: Effort normal and breath sounds normal. No respiratory distress. She has no wheezes.  Abdominal: Soft. Normal appearance and bowel sounds are normal. She exhibits no distension and no mass. There is no hepatosplenomegaly. There is tenderness in the  epigastric area. There is no rebound, no guarding and no CVA tenderness.  Musculoskeletal: Normal range of motion.  Lymphadenopathy:    She has no cervical adenopathy.  Neurological: She is alert. She exhibits normal muscle tone. Coordination normal.  Skin: Skin is warm and dry. No rash noted.  Psychiatric: She has a normal mood and affect.    ED Course  Procedures (including critical care time)   Results for orders placed during the hospital encounter of 02/16/12  CBC WITH DIFFERENTIAL      Component Value Range   WBC 6.9  4.0 - 10.5 K/uL   RBC 4.96  3.87 - 5.11 MIL/uL   Hemoglobin 12.9  12.0 - 15.0 g/dL   HCT 16.1  09.6 - 04.5 %   MCV 81.0  78.0 - 100.0 fL   MCH 26.0  26.0 - 34.0 pg   MCHC 32.1  30.0 - 36.0 g/dL   RDW 40.9  81.1 - 91.4 %   Platelets 294  150 - 400 K/uL   Neutrophils Relative 62  43 - 77 %   Neutro Abs 4.3  1.7 - 7.7 K/uL   Lymphocytes Relative 28  12 - 46 %   Lymphs Abs 1.9  0.7 - 4.0 K/uL   Monocytes Relative 8  3 - 12 %   Monocytes Absolute 0.5  0.1 - 1.0 K/uL   Eosinophils Relative 1  0 - 5 %   Eosinophils Absolute 0.1  0.0 - 0.7 K/uL   Basophils Relative 1  0 - 1 %   Basophils Absolute 0.1  0.0 - 0.1 K/uL  COMPREHENSIVE METABOLIC PANEL      Component Value Range   Sodium 139  135 - 145 mEq/L   Potassium 3.7  3.5 - 5.1 mEq/L   Chloride 104  96 - 112 mEq/L   CO2 23  19 - 32 mEq/L   Glucose, Bld 136 (*) 70 - 99 mg/dL   BUN 13  6 - 23 mg/dL   Creatinine, Ser 7.82  0.50 - 1.10 mg/dL   Calcium 95.6  8.4 - 21.3 mg/dL   Total Protein 8.1  6.0 - 8.3 g/dL   Albumin 4.1  3.5 - 5.2 g/dL   AST 086 (*) 0 - 37 U/L   ALT 491 (*) 0 - 35 U/L   Alkaline Phosphatase 245 (*) 39 - 117 U/L   Total Bilirubin 0.9  0.3 - 1.2 mg/dL   GFR calc non Af Amer >90  >90 mL/min   GFR calc Af Amer >90  >90 mL/min  URINALYSIS, ROUTINE W REFLEX MICROSCOPIC      Component Value Range   Color, Urine YELLOW  YELLOW   APPearance CLEAR  CLEAR   Specific Gravity, Urine 1.028   1.005 - 1.030   pH 5.5  5.0 - 8.0   Glucose, UA NEGATIVE  NEGATIVE mg/dL   Hgb urine dipstick NEGATIVE  NEGATIVE   Bilirubin Urine SMALL (*) NEGATIVE   Ketones, ur 15 (*) NEGATIVE mg/dL   Protein, ur  NEGATIVE  NEGATIVE mg/dL   Urobilinogen, UA 1.0  0.0 - 1.0 mg/dL   Nitrite NEGATIVE  NEGATIVE   Leukocytes, UA NEGATIVE  NEGATIVE  PREGNANCY, URINE      Component Value Range   Preg Test, Ur NEGATIVE  NEGATIVE  POCT I-STAT TROPONIN I      Component Value Range   Troponin i, poc 0.00  0.00 - 0.08 ng/mL   Comment 3           LIPASE, BLOOD      Component Value Range   Lipase 32  11 - 59 U/L   Dg Chest 2 View  02/15/2012  *RADIOLOGY REPORT*  Clinical Data: Chest pain.Sickle cell trait.  CHEST - 2 VIEW  Comparison: None.  Findings: Cardiac and mediastinal contours appear normal.  The lungs appear clear.  No pleural effusion is identified.  IMPRESSION:  No significant abnormality identified.  Original Report Authenticated By: Dellia Cloud, M.D.   1. Abdominal pain, acute, epigastric   2. Elevated LFTs    MDM  Nilam Quakenbush presents with intermittent epigastric pain x 1 week.  Based on Hx and PE I am suspect for PUD vs gallbladder disease.  I do not believe this is an ACS based on symptom etiology, but will evaluate.  Her cardiac labs resulted as negative and CXR is negative.  CBC without leukocytosis.  CMP with significantly elevated liver enzymes making gallstones very likely.  I have ordered a [place] abd and are awaiting results.  I have discussed this patient with Grant Fontana PA-C and she is aware of pt status.          Dahlia Client Kerissa Coia, PA-C 02/16/12 1550

## 2012-02-16 NOTE — ED Provider Notes (Signed)
Assumed care at sign out from the PA. I examined the patient and she had 1 week of epigastric and RUQ pain with vomiting. Exam significant for RUQ tenderness with ? Eulah Pont. Labs significant for elevated LFTs, nl WBC, and nl bili. I called Dr. Lindie Spruce from surgery, who will evaluate the patient.   Richardean Canal, MD 02/16/12 [phone]

## 2012-02-16 NOTE — ED Notes (Signed)
Provided pt with sprite. 

## 2012-02-16 NOTE — ED Provider Notes (Signed)
5:21 PM Care of pt assumed from Muthersbaugh, PA-C. Pt with epigastric pain x 1 week. Pt states she's afraid to eat as this makes the pain worse; she feels better with vomiting. Pt's labs with sig elevated LFTs and alk phos. Pt was pending ultrasound at time of signout, which shows dilatation of CBD w/o evidence of stone, thickening GB wall with stones, concerning for cholecystitis. Dr. Silverio Lay d/w Dr. Lindie Spruce of surgery who will see the pt.  Grant Fontana, PA-C 02/16/12 1727

## 2012-02-16 NOTE — ED Notes (Signed)
MD at bedside. 

## 2012-02-17 ENCOUNTER — Encounter (HOSPITAL_COMMUNITY)
Admission: EM | Disposition: A | Discharge status: Home / Self Care | Payer: Self-pay | Source: Home / Self Care | Attending: Surgery

## 2012-02-17 ENCOUNTER — Encounter (HOSPITAL_COMMUNITY): Payer: Self-pay | Admitting: *Deleted

## 2012-02-17 ENCOUNTER — Inpatient Hospital Stay (HOSPITAL_COMMUNITY): Payer: Medicaid Other | Admitting: Critical Care Medicine

## 2012-02-17 ENCOUNTER — Encounter (HOSPITAL_COMMUNITY): Payer: Self-pay | Admitting: Critical Care Medicine

## 2012-02-17 ENCOUNTER — Inpatient Hospital Stay (HOSPITAL_COMMUNITY): Payer: Medicaid Other

## 2012-02-17 HISTORY — PX: CHOLECYSTECTOMY: SHX55

## 2012-02-17 LAB — SURGICAL PCR SCREEN
MRSA, PCR: NEGATIVE
Staphylococcus aureus: NEGATIVE

## 2012-02-17 SURGERY — LAPAROSCOPIC CHOLECYSTECTOMY WITH INTRAOPERATIVE CHOLANGIOGRAM
Anesthesia: General | Site: Abdomen

## 2012-02-17 MED ORDER — SODIUM CHLORIDE 0.9 % IV SOLN
INTRAVENOUS | Status: DC | PRN
  Administered 2012-02-17: 09:00:00

## 2012-02-17 MED ORDER — PROPOFOL 10 MG/ML IV EMUL
INTRAVENOUS | Status: DC | PRN
  Administered 2012-02-17: 200 mg via INTRAVENOUS

## 2012-02-17 MED ORDER — SUCCINYLCHOLINE CHLORIDE 20 MG/ML IJ SOLN
INTRAMUSCULAR | Status: DC | PRN
  Administered 2012-02-17: 100 mg via INTRAVENOUS

## 2012-02-17 MED ORDER — LIDOCAINE HCL (CARDIAC) 20 MG/ML IV SOLN
INTRAVENOUS | Status: DC | PRN
  Administered 2012-02-17: 100 mg via INTRAVENOUS

## 2012-02-17 MED ORDER — MIDAZOLAM HCL 5 MG/5ML IJ SOLN
INTRAMUSCULAR | Status: DC | PRN
  Administered 2012-02-17: 2 mg via INTRAVENOUS

## 2012-02-17 MED ORDER — HYDROMORPHONE HCL PF 1 MG/ML IJ SOLN
0.2500 mg | INTRAMUSCULAR | Status: DC | PRN
  Administered 2012-02-17: 0.5 mg via INTRAVENOUS

## 2012-02-17 MED ORDER — DEXAMETHASONE SODIUM PHOSPHATE 4 MG/ML IJ SOLN
INTRAMUSCULAR | Status: DC | PRN
  Administered 2012-02-17: 4 mg via INTRAVENOUS

## 2012-02-17 MED ORDER — ACETAMINOPHEN 10 MG/ML IV SOLN
INTRAVENOUS | Status: DC | PRN
  Administered 2012-02-17: 1000 mg via INTRAVENOUS

## 2012-02-17 MED ORDER — BUPIVACAINE-EPINEPHRINE PF 0.25-1:200000 % IJ SOLN
INTRAMUSCULAR | Status: AC
  Filled 2012-02-17: qty 30

## 2012-02-17 MED ORDER — METOCLOPRAMIDE HCL 5 MG/ML IJ SOLN
10.0000 mg | Freq: Once | INTRAMUSCULAR | Status: DC | PRN
  Filled 2012-02-17: qty 2

## 2012-02-17 MED ORDER — SODIUM CHLORIDE 0.9 % IV SOLN
INTRAVENOUS | Status: DC
  Administered 2012-02-17: 11:00:00 via INTRAVENOUS

## 2012-02-17 MED ORDER — LACTATED RINGERS IV SOLN
INTRAVENOUS | Status: DC | PRN
  Administered 2012-02-17 (×2): via INTRAVENOUS

## 2012-02-17 MED ORDER — ONDANSETRON HCL 4 MG/2ML IJ SOLN
INTRAMUSCULAR | Status: DC | PRN
  Administered 2012-02-17: 4 mg via INTRAVENOUS

## 2012-02-17 MED ORDER — OXYCODONE HCL 5 MG PO TABS: 5.0000 mg | ORAL_TABLET | Freq: Once | ORAL | Status: DC | PRN

## 2012-02-17 MED ORDER — METOCLOPRAMIDE HCL 5 MG/ML IJ SOLN
INTRAMUSCULAR | Status: DC | PRN
  Administered 2012-02-17: 10 mg via INTRAVENOUS

## 2012-02-17 MED ORDER — FENTANYL CITRATE 0.05 MG/ML IJ SOLN
INTRAMUSCULAR | Status: DC | PRN
  Administered 2012-02-17: 100 ug via INTRAVENOUS
  Administered 2012-02-17: 50 ug via INTRAVENOUS
  Administered 2012-02-17: 100 ug via INTRAVENOUS

## 2012-02-17 MED ORDER — OXYCODONE HCL 5 MG/5ML PO SOLN: 5.0000 mg | Freq: Once | ORAL | Status: DC | PRN

## 2012-02-17 MED ORDER — ROCURONIUM BROMIDE 100 MG/10ML IV SOLN
INTRAVENOUS | Status: DC | PRN
  Administered 2012-02-17: 10 mg via INTRAVENOUS
  Administered 2012-02-17: 30 mg via INTRAVENOUS

## 2012-02-17 MED ORDER — HYDROMORPHONE HCL PF 1 MG/ML IJ SOLN
INTRAMUSCULAR | Status: AC
  Filled 2012-02-17: qty 1

## 2012-02-17 MED ORDER — OXYCODONE HCL 5 MG PO TABS
5.0000 mg | ORAL_TABLET | ORAL | Status: DC | PRN
  Administered 2012-02-17 – 2012-02-18 (×4): 5 mg via ORAL
  Filled 2012-02-17 (×4): qty 1

## 2012-02-17 MED ORDER — GLYCOPYRROLATE 0.2 MG/ML IJ SOLN
INTRAMUSCULAR | Status: DC | PRN
  Administered 2012-02-17: 0.6 mg via INTRAVENOUS

## 2012-02-17 MED ORDER — HEMOSTATIC AGENTS (NO CHARGE) OPTIME
TOPICAL | Status: DC | PRN
  Administered 2012-02-17: 1

## 2012-02-17 MED ORDER — BUPIVACAINE-EPINEPHRINE 0.25% -1:200000 IJ SOLN
INTRAMUSCULAR | Status: DC | PRN
  Administered 2012-02-17: 10 mL

## 2012-02-17 MED ORDER — NEOSTIGMINE METHYLSULFATE 1 MG/ML IJ SOLN
INTRAMUSCULAR | Status: DC | PRN
  Administered 2012-02-17: 4 mg via INTRAVENOUS

## 2012-02-17 SURGICAL SUPPLY — 39 items
ADH SKN CLS APL DERMABOND .7 (GAUZE/BANDAGES/DRESSINGS) ×1
APPLIER CLIP 5 13 M/L LIGAMAX5 (MISCELLANEOUS) ×2
APR CLP MED LRG 5 ANG JAW (MISCELLANEOUS) ×1
BAG SPEC RTRVL LRG 6X4 10 (ENDOMECHANICALS) ×1
BLADE SURG ROTATE 9660 (MISCELLANEOUS) IMPLANT
CANISTER SUCTION 2500CC (MISCELLANEOUS) ×2 IMPLANT
CHLORAPREP W/TINT 26ML (MISCELLANEOUS) ×2 IMPLANT
CLIP APPLIE 5 13 M/L LIGAMAX5 (MISCELLANEOUS) ×1 IMPLANT
CLOTH BEACON ORANGE TIMEOUT ST (SAFETY) ×2 IMPLANT
CLSR STERI-STRIP ANTIMIC 1/2X4 (GAUZE/BANDAGES/DRESSINGS) ×1 IMPLANT
COVER MAYO STAND STRL (DRAPES) ×2 IMPLANT
COVER SURGICAL LIGHT HANDLE (MISCELLANEOUS) ×2 IMPLANT
DECANTER SPIKE VIAL GLASS SM (MISCELLANEOUS) ×4 IMPLANT
DERMABOND ADVANCED (GAUZE/BANDAGES/DRESSINGS) ×1
DERMABOND ADVANCED .7 DNX12 (GAUZE/BANDAGES/DRESSINGS) ×1 IMPLANT
DRAPE C-ARM 42X72 X-RAY (DRAPES) ×2 IMPLANT
ELECT REM PT RETURN 9FT ADLT (ELECTROSURGICAL) ×2
ELECTRODE REM PT RTRN 9FT ADLT (ELECTROSURGICAL) ×1 IMPLANT
GLOVE BIO SURGEON STRL SZ7 (GLOVE) ×2 IMPLANT
GLOVE BIOGEL PI IND STRL 7.5 (GLOVE) ×1 IMPLANT
GLOVE BIOGEL PI INDICATOR 7.5 (GLOVE) ×1
GOWN STRL NON-REIN LRG LVL3 (GOWN DISPOSABLE) ×8 IMPLANT
HEMOSTAT SNOW SURGICEL 2X4 (HEMOSTASIS) ×1 IMPLANT
KIT BASIN OR (CUSTOM PROCEDURE TRAY) ×2 IMPLANT
KIT ROOM TURNOVER OR (KITS) ×2 IMPLANT
NS IRRIG 1000ML POUR BTL (IV SOLUTION) ×2 IMPLANT
PAD ARMBOARD 7.5X6 YLW CONV (MISCELLANEOUS) ×2 IMPLANT
POUCH SPECIMEN RETRIEVAL 10MM (ENDOMECHANICALS) ×2 IMPLANT
SCISSORS LAP 5X35 DISP (ENDOMECHANICALS) IMPLANT
SET CHOLANGIOGRAPH 5 50 .035 (SET/KITS/TRAYS/PACK) ×2 IMPLANT
SET IRRIG TUBING LAPAROSCOPIC (IRRIGATION / IRRIGATOR) ×2 IMPLANT
SLEEVE ENDOPATH XCEL 5M (ENDOMECHANICALS) ×4 IMPLANT
SPECIMEN JAR SMALL (MISCELLANEOUS) ×2 IMPLANT
SUT MNCRL AB 4-0 PS2 18 (SUTURE) ×2 IMPLANT
TOWEL OR 17X24 6PK STRL BLUE (TOWEL DISPOSABLE) ×2 IMPLANT
TOWEL OR 17X26 10 PK STRL BLUE (TOWEL DISPOSABLE) ×2 IMPLANT
TRAY LAPAROSCOPIC (CUSTOM PROCEDURE TRAY) ×2 IMPLANT
TROCAR XCEL BLUNT TIP 100MML (ENDOMECHANICALS) ×2 IMPLANT
TROCAR XCEL NON-BLD 5MMX100MML (ENDOMECHANICALS) ×2 IMPLANT

## 2012-02-17 NOTE — ED Provider Notes (Signed)
Medical screening examination/treatment/procedure(s) were performed by non-physician practitioner and as supervising physician I was immediately available for consultation/collaboration.   Laray Anger, DO 02/17/12 1237

## 2012-02-17 NOTE — Anesthesia Postprocedure Evaluation (Signed)
Anesthesia Post Note  Patient: Julia Arellano  Procedure(s) Performed: Procedure(s) (LRB): LAPAROSCOPIC CHOLECYSTECTOMY WITH INTRAOPERATIVE CHOLANGIOGRAM (N/A)  Anesthesia type: general  Patient location: PACU  Post pain: Pain level controlled  Post assessment: Patient's Cardiovascular Status Stable  Last Vitals:  Filed Vitals:   02/17/12 1002  BP: 104/58  Pulse: 90  Temp: 36.8 C  Resp: 18    Post vital signs: Reviewed and stable  Level of consciousness: sedated  Complications: No apparent anesthesia complications

## 2012-02-17 NOTE — Op Note (Signed)
Preoperative diagnosis: acute cholecystitis Postoperative diagnosis: saa Procedure: laparoscopic cholecystectomy with cholangiogram Surgeon: Dr Harden Mo  Assistant: Dr. Cicero Duck Anesthesia: geta Supervising anesthesiologist: Dr. Isidor Holts Specimens: Gallbladder and contents to pathology Estimated blood loss: Minimal Complications: None Sponge needle count was correct x2 at end of operation Disposition to recovery in stable condition  Indications: This is a 36 year old female who presents with right upper quadrant pain and an ultrasound that shows a gallbladder full of stones. She also had a dilated common bile duct and elevated transaminases but a normal bilirubin. She was admitted by one of my partners and I discussed a laparoscopic cholecystectomy with cholangiogram this morning. We discussed the risks and benefits of the operation prior to beginning.  Procedure: After informed consent was obtained the patient was taken to the operating room. She was administered Unasyn. She had sequential compression devices placed on her legs. She was placed under general anesthesia without complication. Her abdomen was prepped and draped in the standard sterile surgical fashion. A surgical timeout was performed.  I infiltrated quarter percent Marcaine below her umbilicus. I then made a vertical incision and carried this out to her fascia. The fascia was entered sharply and the peritoneum was entered bluntly. A 0 Vicryl pursestring suture was placed through the fascia and a Hassan trocar was introduced. I then insufflated the abdomen to 15 mm mercury pressure. I then placed 3 further 5 mm ports in the epigastrium and right side of the abdomen under direct vision without complication after infiltration with local anesthetic. The gallbladder was noted to be very scarred in. I retracted it cephalad. Using a blunt dissection I then released the omentum as well as the duodenum which was adherent to  the gallbladder. There was no injury noted during this portion. I then retracted the gallbladder cephalad and lateral. It took me some time but eventually I was able to obtain the critical view of safety. I then divided the artery after placing clips. I then placed a clip distally on the cystic duct. I introduced a Cook catheter. I made a ductotomy in places in the duct and secured with a clip. A cholangiogram was performed. Unfortunately the C-arm did not record these images. I viewed these images while they were being done. The catheter was in the cystic duct and there did not appear to be any large filling defects and I did get contrast into the duodenum. Both sides of the liver also filled. She did have a dilated common duct consistent with her ultrasound. I then removed the catheter. I clipped the duct and divided this. I then removed the gallbladder from the liver bed with some difficulty. This was then placed in an Endo Catch bag and removed. Hemostasis was then obtained. I did place a piece of Surgicel so in the bed of the gallbladder. All the irrigant was removed. I then tied down my pursestring suture after removing the trocar. I did place an additional stitch as I enlarged this to get the gallbladder out. This obliterated the defect. I then desufflated the abdomen and removed all trocars. The incisions were closed with 4-0 Monocryl and Dermabond. She tolerated this well was extubated transferred recovery stable.

## 2012-02-17 NOTE — Anesthesia Preprocedure Evaluation (Addendum)
Anesthesia Evaluation  Patient identified by MRN, date of birth, ID band Patient awake    Reviewed: Allergy & Precautions, H&P , NPO status , Patient's Chart, lab work & pertinent test results  History of Anesthesia Complications Negative for: history of anesthetic complications  Airway Mallampati: I TM Distance: >3 FB Neck ROM: Full    Dental  (+) Dental Advisory Given, Partial Upper and Teeth Intact   Pulmonary          Cardiovascular     Neuro/Psych    GI/Hepatic Acute cholecystitis   Endo/Other  Morbid obesity  Renal/GU      Musculoskeletal   Abdominal   Peds  Hematology Sickle cell trait   Anesthesia Other Findings   Reproductive/Obstetrics                         Anesthesia Physical Anesthesia Plan  ASA: II  Anesthesia Plan: General   Post-op Pain Management:    Induction: Intravenous  Airway Management Planned: Oral ETT  Additional Equipment:   Intra-op Plan:   Post-operative Plan: Extubation in OR  Informed Consent: I have reviewed the patients History and Physical, chart, labs and discussed the procedure including the risks, benefits and alternatives for the proposed anesthesia with the patient or authorized representative who has indicated his/her understanding and acceptance.   Dental advisory given  Plan Discussed with: Anesthesiologist and Surgeon  Anesthesia Plan Comments:         Anesthesia Quick Evaluation

## 2012-02-17 NOTE — Preoperative (Signed)
Beta Blockers   Reason not to administer Beta Blockers:Not Applicable 

## 2012-02-17 NOTE — Transfer of Care (Signed)
Immediate Anesthesia Transfer of Care Note  Patient: Julia Arellano  Procedure(s) Performed: Procedure(s) (LRB): LAPAROSCOPIC CHOLECYSTECTOMY WITH INTRAOPERATIVE CHOLANGIOGRAM (N/A)  Patient Location: PACU  Anesthesia Type: General  Level of Consciousness: awake and alert   Airway & Oxygen Therapy: Patient Spontanous Breathing and Patient connected to nasal cannula oxygen  Post-op Assessment: Report given to PACU RN, Post -op Vital signs reviewed and stable and Patient moving all extremities X 4  Post vital signs: Reviewed and stable  Complications: No apparent anesthesia complications

## 2012-02-17 NOTE — Anesthesia Procedure Notes (Signed)
Procedure Name: Intubation Date/Time: 02/17/2012 7:36 AM Performed by: Elon Alas Pre-anesthesia Checklist: Patient identified, Timeout performed, Emergency Drugs available, Suction available and Patient being monitored Patient Re-evaluated:Patient Re-evaluated prior to inductionOxygen Delivery Method: Circle system utilized Preoxygenation: Pre-oxygenation with 100% oxygen Intubation Type: IV induction, Rapid sequence and Cricoid Pressure applied Ventilation: Mask ventilation without difficulty Laryngoscope Size: Mac and 3 Grade View: Grade I Tube type: Oral Tube size: 7.5 mm Number of attempts: 1 Airway Equipment and Method: Stylet Placement Confirmation: positive ETCO2,  ETT inserted through vocal cords under direct vision and breath sounds checked- equal and bilateral Secured at: 21 cm Tube secured with: Tape Dental Injury: Teeth and Oropharynx as per pre-operative assessment

## 2012-02-18 DIAGNOSIS — K810 Acute cholecystitis: Principal | ICD-10-CM | POA: Diagnosis present

## 2012-02-18 LAB — COMPREHENSIVE METABOLIC PANEL
ALT: 260 U/L — ABNORMAL HIGH (ref 0–35)
CO2: 23 mEq/L (ref 19–32)
Calcium: 8.9 mg/dL (ref 8.4–10.5)
Chloride: 103 mEq/L (ref 96–112)
Creatinine, Ser: 0.72 mg/dL (ref 0.50–1.10)
GFR calc Af Amer: >90 mL/min (ref >90)
GFR calc non Af Amer: >90 mL/min (ref >90)
Glucose, Bld: 106 mg/dL — ABNORMAL HIGH (ref 70–99)
Total Bilirubin: 0.4 mg/dL (ref 0.3–1.2)

## 2012-02-18 MED ORDER — OXYCODONE HCL 5 MG PO TABS: 5.0000 mg | ORAL_TABLET | ORAL | Status: AC | PRN

## 2012-02-18 NOTE — Progress Notes (Signed)
Dc instructions gone over with patient. Follow up appointment to be made. Prescription given. Home medications gone over. Diet and activity, incisional care, and signs and symptoms of infection discussed. Patient verbalized understanding of all.

## 2012-02-18 NOTE — Progress Notes (Signed)
1 Day Post-Op  Subjective: She feels okay. Had a little "gas" discomfort last night. No significant incisional pain. Aren't solid diet. Has been ambulating. No nausea.  Objective: Vital signs in last 24 hours: Temp:  [98.4 F (36.9 C)-99.2 F (37.3 C)] 98.8 F (37.1 C) (08/11 0654) Pulse Rate:  [62-91] 91  (08/11 0654) Resp:  [16-18] 18  (08/11 0654) BP: (95-132)/(54-72) 132/72 mmHg (08/11 0654) SpO2:  [98 %-99 %] 99 % (08/11 0654)   Intake/Output from previous day: 08/10 0701 - 08/11 0700 In: 1866.3 [P.O.:480; I.V.:1386.3] Out: 25 [Blood:25] Intake/Output this shift: Total I/O In: 480 [P.O.:480] Out: -    General appearance: alert, cooperative and no distress Resp: clear to auscultation bilaterally GI: soft, non-tender; bowel sounds normal; no masses,  no organomegaly  Incision: healing well  Lab Results:   Basename 02/16/12 1213 02/15/12 1608  WBC 6.9 7.7  HGB 12.9 12.9  HCT 40.2 40.1  PLT 294 265   BMET  Basename 02/18/12 0604 02/16/12 1213  NA 136 139  K 3.6 3.7  CL 103 104  CO2 23 23  GLUCOSE 106* 136*  BUN 9 13  CREATININE 0.72 0.72  CALCIUM 8.9 10.2   PT/INR No results found for this basename: LABPROT:2,INR:2 in the last 72 hours ABG No results found for this basename: PHART:2,PCO2:2,PO2:2,HCO3:2 in the last 72 hours  MEDS, Scheduled    . HYDROmorphone      . DISCONTD: ampicillin-sulbactam (UNASYN) IV  3 g Intravenous Q6H  . DISCONTD: pantoprazole (PROTONIX) IV  40 mg Intravenous QHS    Studies/Results: Dg Cholangiogram Operative  02/17/2012  *RADIOLOGY REPORT*  Clinical Data:   Cholecystectomy  INTRAOPERATIVE CHOLANGIOGRAM  Technique:  Cholangiographic images from the C-arm fluoroscopic device were submitted for interpretation post-operatively.  Please see the procedural report for the amount of contrast and the fluoroscopy time utilized.  Comparison:  None.  Findings:  A single spot radiographic images of the right upper abdominal quadrant  during laparoscopic cholecystectomy is provided for review.  Surgical clips overlie the expected location of the gallbladder fossa.  Contrast injection demonstrates selective cannulation of the central aspect of the cystic duct.  There is opacification of the central aspect of the cystic duct with filling of a mildly dilated common bile duct.  There is opacification of a small portion of the descending portion of the duodenum and minimal reflux of injected contrast into the common hepatic duct which appears mildly dilated.  There are no discrete filling defects within the opacified portions of the biliary system to suggest the presence of choledocholithiasis.  IMPRESSION:  Intraoperative cholangiogram as above.  No discrete filling defects on the single spot radiograph to suggest the presence of choledocholithiasis.  Original Report Authenticated By: Waynard Reeds, M.D.   US Abdomen Complete  02/16/2012  *RADIOLOGY REPORT*  Clinical Data:  1-week history of epigastric abdominal pain which is worse after edema.  COMPLETE ABDOMINAL ULTRASOUND  Comparison:  None.  Findings:  Gallbladder:  Numerous shadowing gallstones within a contracted gallbladder.  Gallbladder wall thickening up to approximately 6 mm. No pericholecystic fluid.  Negative sonographic Murphy's sign according to the ultrasound technologist.  Common bile duct:  Dilated up to approximately 8 mm in diameter. No visible common bile duct stone, though the distal duct was obscured by duodenal bowel gas.  Liver:  Normal size and echotexture without focal parenchymal abnormality.  Patent portal vein with hepatopetal flow.  IVC:  Patent.  Pancreas:  Although the pancreas is  difficult to visualize in its entirety, no focal pancreatic abnormality is identified.  The head was obscured by duodenal bowel gas.  Spleen:  Normal size and echotexture without focal parenchymal abnormality.  Right Kidney:  No hydronephrosis.  Well-preserved cortex.  No shadowing  calculi.  Normal size and parenchymal echotexture without focal abnormalities.  Approximately 11.1 cm in length.  Left Kidney:  No hydronephrosis.  Well-preserved cortex.  No shadowing calculi.  Normal size and parenchymal echotexture without focal abnormalities.  Approximately 10.4 cm in length.  Abdominal aorta:  Normal in caliber throughout its visualized course in the abdomen without significant atherosclerosis.  IMPRESSION:  1.  Cholelithiasis.  Contracted gallbladder with gallbladder wall thickening.  Negative sonographic Murphy's sign. 2.  Mild biliary ductal dilation up to 8 mm.  No visible bile duct stone, though the distal duct was obscured by duodenal bowel gas and was therefore not evaluated. 3.  Otherwise normal examination with a caveat that the pancreatic head was also obscured by duodenal bowel gas and was not evaluated.  Original Report Authenticated By: Arnell Sieving, M.D.    Assessment: s/p Procedure(s): LAPAROSCOPIC CHOLECYSTECTOMY WITH INTRAOPERATIVE CHOLANGIOGRAM Patient Active Problem List  Diagnosis  . Acute cholecystitis    Doing well and ready to go home.  Plan: Discharge   LOS: 2 days     Currie Paris, MD, [facility], [place] [phone]   02/18/2012 10:27 AM

## 2012-02-19 LAB — H. PYLORI ANTIBODY, IGG: H Pylori IgG: <0.4 {ISR}

## 2012-02-20 ENCOUNTER — Encounter (HOSPITAL_COMMUNITY): Payer: Self-pay | Admitting: General Surgery

## 2012-02-21 NOTE — Discharge Summary (Signed)
Patient ID: Julia Arellano [id number] 36 y.o. 15-Nov-1975  02/16/2012  Discharge date and time: 02/18/2012 11:56 AM  Admitting Physician: Manus Rudd Discharge Physician: Currie Paris  Admission Diagnoses: Cholecystitis [575.10] BACK/CHEST PAIN  Discharge Diagnoses: Chronic active cholecystitis and cholelithiasis  Operations: Procedure(s): LAPAROSCOPIC CHOLECYSTECTOMY WITH INTRAOPERATIVE CHOLANGIOGRAM    Discharged Condition: good    Hospital Course: Patient admitted, had cholecystectomy, did well and discharged the next day   Consults: None  Significant Diagnostic Studies: radiology: IOC was wnl  Treatments: surgery:   Disposition: Home

## 2012-03-04 ENCOUNTER — Encounter (INDEPENDENT_AMBULATORY_CARE_PROVIDER_SITE_OTHER): Payer: Self-pay | Admitting: General Surgery

## 2012-03-06 ENCOUNTER — Telehealth (INDEPENDENT_AMBULATORY_CARE_PROVIDER_SITE_OTHER): Payer: Self-pay

## 2012-03-06 NOTE — Telephone Encounter (Signed)
LMOM giving the pt their f/u appt with Dr Dwain Sarna on 9/5 arrive 1:15 for 1:30.

## 2012-03-14 ENCOUNTER — Encounter (INDEPENDENT_AMBULATORY_CARE_PROVIDER_SITE_OTHER): Payer: Self-pay | Admitting: General Surgery

## 2012-03-26 ENCOUNTER — Encounter (INDEPENDENT_AMBULATORY_CARE_PROVIDER_SITE_OTHER): Payer: Self-pay | Admitting: General Surgery

## 2012-05-03 ENCOUNTER — Encounter (INDEPENDENT_AMBULATORY_CARE_PROVIDER_SITE_OTHER): Payer: Self-pay | Admitting: General Surgery

## 2012-05-03 ENCOUNTER — Ambulatory Visit (INDEPENDENT_AMBULATORY_CARE_PROVIDER_SITE_OTHER): Payer: Medicaid Other | Admitting: General Surgery

## 2012-05-03 VITALS — BP 119/75 | HR 80 | Temp 98.6°F | Resp 14 | Ht 66.0 in | Wt 198.2 lb

## 2012-05-03 DIAGNOSIS — Z09 Encounter for follow-up examination after completed treatment for conditions other than malignant neoplasm: Secondary | ICD-10-CM

## 2012-05-03 NOTE — Progress Notes (Signed)
Subjective:     Patient ID: Julia Arellano, female   DOB: January 24, 1976, 36 y.o.   MRN: [id number]  HPI This is a 36 year old female I did a laparoscopic cholecystectomy on for cholecystitis. She's doing well without complaints. Her pathology showed cholelithiasis and cholecystitis.  Review of Systems     Objective:   Physical Exam Well-healed laparoscopic incisions without infection    Assessment:     Status post laparoscopic cholecystectomy    Plan:     She can return to normal activity. She can eat what she wants at this point. I will see her back as needed.

## 2013-05-25 ENCOUNTER — Emergency Department (HOSPITAL_BASED_OUTPATIENT_CLINIC_OR_DEPARTMENT_OTHER): Payer: Medicaid Other

## 2013-05-25 ENCOUNTER — Encounter (HOSPITAL_BASED_OUTPATIENT_CLINIC_OR_DEPARTMENT_OTHER): Payer: Self-pay | Admitting: Emergency Medicine

## 2013-05-25 ENCOUNTER — Emergency Department (HOSPITAL_BASED_OUTPATIENT_CLINIC_OR_DEPARTMENT_OTHER)
Admission: EM | Admit: 2013-05-25 | Discharge: 2013-05-25 | Disposition: A | Discharge status: Home / Self Care | Payer: Medicaid Other | Attending: Emergency Medicine | Admitting: Emergency Medicine

## 2013-05-25 DIAGNOSIS — J069 Acute upper respiratory infection, unspecified: Secondary | ICD-10-CM | POA: Insufficient documentation

## 2013-05-25 DIAGNOSIS — Z862 Personal history of diseases of the blood and blood-forming organs and certain disorders involving the immune mechanism: Secondary | ICD-10-CM | POA: Insufficient documentation

## 2013-05-25 LAB — RAPID STREP SCREEN (MED CTR MEBANE ONLY): Streptococcus, Group A Screen (Direct): NEGATIVE

## 2013-05-25 MED ORDER — HYDROCOD POLST-CHLORPHEN POLST 10-8 MG/5ML PO LQCR: 5.0000 mL | Freq: Two times a day (BID) | ORAL | Status: DC | PRN

## 2013-05-25 NOTE — ED Provider Notes (Signed)
CSN: [id number]     Arrival date & time 05/25/13  1421 History  This chart was scribed for Gerhard Munch, MD by Leone Payor, ED Scribe. This patient was seen in room MH07/MH07 and the patient's care was started 3:51 PM.    Chief Complaint  Patient presents with  . Fever  . Sore Throat    The history is provided by the patient. No language interpreter was used.    HPI Comments: Julia Arellano is a 37 y.o. female who presents to the Emergency Department complaining of 4 days of gradual onset, gradually worsening, constant cough and sore throat. She also reports having myalgias, ear pain, congestion. She states she had subjective fevers 2 days ago. She has tried to take OTC medications such as Tylenol, Alka Seltzer, and Nyquil. Pt states she has not had her flu vaccine this season. She denies abdominal pain, nausea, vomiting, diarrhea.   Past Medical History  Diagnosis Date  . Syncope   . Sickle cell trait    Past Surgical History  Procedure Laterality Date  . Cesarean section    . Cholecystectomy  02/17/2012    Procedure: LAPAROSCOPIC CHOLECYSTECTOMY WITH INTRAOPERATIVE CHOLANGIOGRAM;  Surgeon: Emelia Loron, MD;  Location: [facility] OR;  Service: General;  Laterality: N/A;   History reviewed. No pertinent family history. History  Substance Use Topics  . Smoking status: Never Smoker   . Smokeless tobacco: Never Used  . Alcohol Use: Yes     Comment: socially   OB History   Grav Para Term Preterm Abortions TAB SAB Ect Mult Living                 Review of Systems  Constitutional: Positive for fever (subjective).       Per HPI, otherwise negative  HENT: Positive for congestion, ear pain and sore throat.        Per HPI, otherwise negative  Respiratory: Positive for cough and shortness of breath (mild).        Per HPI, otherwise negative  Cardiovascular: Negative for chest pain.       Per HPI, otherwise negative  Gastrointestinal: Negative for vomiting, abdominal pain and  diarrhea.  Endocrine:       Negative aside from HPI  Genitourinary:       Neg aside from HPI   Musculoskeletal: Positive for myalgias.       Per HPI, otherwise negative  Skin: Negative.  Negative for rash.  Neurological: Negative for syncope.  Psychiatric/Behavioral: Negative for confusion.    Allergies  Review of patient's allergies indicates no known allergies.  Home Medications  No current outpatient prescriptions on file. BP 111/64  Pulse 87  Temp(Src) 98.7 F (37.1 C)  Resp 16  Wt 199 lb (90.266 kg)  SpO2 99%  LMP 04/19/2013 Physical Exam  Nursing note and vitals reviewed. Constitutional: She is oriented to person, place, and time. She appears well-developed and well-nourished. No distress.  HENT:  Head: Normocephalic and atraumatic.  Mouth/Throat: Posterior oropharyngeal erythema present. No oropharyngeal exudate.  Eyes: Conjunctivae and EOM are normal.  Cardiovascular: Normal rate, regular rhythm and normal heart sounds.   Pulmonary/Chest: Effort normal and breath sounds normal. No stridor. No respiratory distress. She has no wheezes. She has no rales.  Abdominal: Soft. She exhibits no distension. There is no tenderness.  Musculoskeletal: She exhibits no edema.  Lymphadenopathy:    She has no cervical adenopathy.  Neurological: She is alert and oriented to person, place, and time. No cranial  nerve deficit.  Skin: Skin is warm and dry.  Psychiatric: She has a normal mood and affect.    ED Course  Procedures   DIAGNOSTIC STUDIES: Oxygen Saturation is 99% on RA, normal by my interpretation.    COORDINATION OF CARE: 3:37 PM Will order CXR to rule out pneumonia and a rapid strep test. Discussed treatment plan with pt at bedside and pt agreed to plan.   Labs Review Labs Reviewed - No data to display Imaging Review Dg Chest 2 View  05/25/2013   CLINICAL DATA:  Cough, fever, congestion for 1 week.  EXAM: CHEST  2 VIEW  COMPARISON:  02/15/2012.  FINDINGS: The  heart size and mediastinal contours are within normal limits. Both lungs are clear. The visualized skeletal structures are unremarkable.  IMPRESSION: No active cardiopulmonary disease.   Electronically Signed   By: Elige Ko   On: 05/25/2013 16:11    EKG Interpretation   None      Update: On repeat exam the patient is resting.  She remained hemodynamically stable.  I discussed all results with her and her companion. MDM  No diagnosis found.  I personally performed the services described in this documentation, which was scribed in my presence. The recorded information has been reviewed and is accurate.   This generally well young female presents with several days of generalized discomfort, cough, sore throat, subjective fever.  On exam she is awake and alert, afebrile.  With her multiple concerns, she had evaluation with x-ray, strep test.  These results were reassuring, the patient remained clinically stable throughout her emergency department course. With her youth, the absence of risk factors, there is low suspicion for occult systemic infection, and no suspicion for PE, ACS, or other acute pathology. Patient was discharged in stable condition with home care instructions, short course of medication, return precautions, follow up instructions.  Gerhard Munch, MD 05/25/13 [phone]

## 2013-05-25 NOTE — ED Notes (Signed)
Pt having sore throat, fever, body aches, runny nose, cough, laryngitis since Wednesday.  No N/V/D.

## 2013-05-27 LAB — CULTURE, GROUP A STREP

## 2013-10-18 ENCOUNTER — Emergency Department (HOSPITAL_COMMUNITY)
Admission: EM | Admit: 2013-10-18 | Discharge: 2013-10-18 | Disposition: A | Discharge status: Home / Self Care | Payer: No Typology Code available for payment source | Attending: Emergency Medicine | Admitting: Emergency Medicine

## 2013-10-18 ENCOUNTER — Encounter (HOSPITAL_COMMUNITY): Payer: Self-pay | Admitting: Emergency Medicine

## 2013-10-18 DIAGNOSIS — D573 Sickle-cell trait: Secondary | ICD-10-CM | POA: Insufficient documentation

## 2013-10-18 DIAGNOSIS — Z791 Long term (current) use of non-steroidal anti-inflammatories (NSAID): Secondary | ICD-10-CM | POA: Insufficient documentation

## 2013-10-18 DIAGNOSIS — Y92410 Unspecified street and highway as the place of occurrence of the external cause: Secondary | ICD-10-CM | POA: Insufficient documentation

## 2013-10-18 DIAGNOSIS — M545 Low back pain, unspecified: Secondary | ICD-10-CM

## 2013-10-18 DIAGNOSIS — Z79899 Other long term (current) drug therapy: Secondary | ICD-10-CM | POA: Insufficient documentation

## 2013-10-18 DIAGNOSIS — S161XXA Strain of muscle, fascia and tendon at neck level, initial encounter: Secondary | ICD-10-CM

## 2013-10-18 DIAGNOSIS — R011 Cardiac murmur, unspecified: Secondary | ICD-10-CM | POA: Insufficient documentation

## 2013-10-18 DIAGNOSIS — S20219A Contusion of unspecified front wall of thorax, initial encounter: Secondary | ICD-10-CM

## 2013-10-18 DIAGNOSIS — S139XXA Sprain of joints and ligaments of unspecified parts of neck, initial encounter: Secondary | ICD-10-CM | POA: Insufficient documentation

## 2013-10-18 DIAGNOSIS — Y9389 Activity, other specified: Secondary | ICD-10-CM | POA: Insufficient documentation

## 2013-10-18 MED ORDER — HYDROCODONE-ACETAMINOPHEN 5-325 MG PO TABS: 1.0000 | ORAL_TABLET | ORAL | Status: DC | PRN

## 2013-10-18 MED ORDER — IBUPROFEN 800 MG PO TABS: 800.0000 mg | ORAL_TABLET | Freq: Three times a day (TID) | ORAL | Status: AC

## 2013-10-18 MED ORDER — CYCLOBENZAPRINE HCL 10 MG PO TABS: 10.0000 mg | ORAL_TABLET | Freq: Every day | ORAL | Status: DC
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
## 2013-10-22 NOTE — ED Provider Notes (Signed)
Medical screening examination/treatment/procedure(s) were performed by non-physician practitioner and as supervising physician I was immediately available for consultation/collaboration.   EKG Interpretation None        Enid SkeensJoshua M Lakeisa Heninger, MD 10/22/13 [phone]

## 2014-05-04 ENCOUNTER — Observation Stay (HOSPITAL_COMMUNITY)
Admission: EM | Admit: 2014-05-04 | Discharge: 2014-05-04 | Disposition: A | Discharge status: Home / Self Care | Payer: Medicaid Other | Attending: Emergency Medicine | Admitting: Emergency Medicine

## 2014-05-04 ENCOUNTER — Encounter (HOSPITAL_COMMUNITY): Payer: Self-pay | Admitting: Emergency Medicine

## 2014-05-04 ENCOUNTER — Emergency Department (HOSPITAL_COMMUNITY): Payer: Medicaid Other

## 2014-05-04 DIAGNOSIS — R1010 Upper abdominal pain, unspecified: Secondary | ICD-10-CM | POA: Diagnosis not present

## 2014-05-04 DIAGNOSIS — Z79899 Other long term (current) drug therapy: Secondary | ICD-10-CM | POA: Diagnosis not present

## 2014-05-04 DIAGNOSIS — R1011 Right upper quadrant pain: Secondary | ICD-10-CM

## 2014-05-04 DIAGNOSIS — R1031 Right lower quadrant pain: Secondary | ICD-10-CM

## 2014-05-04 DIAGNOSIS — J209 Acute bronchitis, unspecified: Secondary | ICD-10-CM | POA: Diagnosis present

## 2014-05-04 DIAGNOSIS — D573 Sickle-cell trait: Secondary | ICD-10-CM | POA: Diagnosis not present

## 2014-05-04 LAB — CBC WITH DIFFERENTIAL/PLATELET
BASOS ABS: 0 10*3/uL (ref 0.0–0.1)
Basophils Relative: 0 % (ref 0–1)
EOS ABS: 0 10*3/uL (ref 0.0–0.7)
EOS PCT: 0 % (ref 0–5)
HCT: 37.7 % (ref 36.0–46.0)
Hemoglobin: 12.4 g/dL (ref 12.0–15.0)
LYMPHS ABS: 2 10*3/uL (ref 0.7–4.0)
LYMPHS PCT: 12 % (ref 12–46)
MCH: 27.7 pg (ref 26.0–34.0)
MCHC: 32.9 g/dL (ref 30.0–36.0)
MCV: 84.3 fL (ref 78.0–100.0)
Monocytes Absolute: 0.9 10*3/uL (ref 0.1–1.0)
Monocytes Relative: 6 % (ref 3–12)
NEUTROS PCT: 82 % — AB (ref 43–77)
Neutro Abs: 13.3 10*3/uL — ABNORMAL HIGH (ref 1.7–7.7)
PLATELETS: 209 10*3/uL (ref 150–400)
RBC: 4.47 MIL/uL (ref 3.87–5.11)
RDW: 13.5 % (ref 11.5–15.5)
WBC: 16.3 10*3/uL — AB (ref 4.0–10.5)

## 2014-05-04 LAB — URINALYSIS, ROUTINE W REFLEX MICROSCOPIC
Bilirubin Urine: NEGATIVE
GLUCOSE, UA: NEGATIVE mg/dL
HGB URINE DIPSTICK: NEGATIVE
Ketones, ur: NEGATIVE mg/dL
LEUKOCYTES UA: NEGATIVE
Nitrite: NEGATIVE
PH: 7 (ref 5.0–8.0)
PROTEIN: NEGATIVE mg/dL
SPECIFIC GRAVITY, URINE: 1.018 (ref 1.005–1.030)
Urobilinogen, UA: 0.2 mg/dL (ref 0.0–1.0)

## 2014-05-04 LAB — COMPREHENSIVE METABOLIC PANEL
ALBUMIN: 3.7 g/dL (ref 3.5–5.2)
ALK PHOS: 87 U/L (ref 39–117)
ALT: 20 U/L (ref 0–35)
AST: 22 U/L (ref 0–37)
Anion gap: 15 (ref 5–15)
BUN: 11 mg/dL (ref 6–23)
CALCIUM: 9.2 mg/dL (ref 8.4–10.5)
CO2: 22 mEq/L (ref 19–32)
Chloride: 100 mEq/L (ref 96–112)
Creatinine, Ser: 0.74 mg/dL (ref 0.50–1.10)
GFR calc non Af Amer: >90 mL/min (ref >90)
GLUCOSE: 91 mg/dL (ref 70–99)
POTASSIUM: 3.7 meq/L (ref 3.7–5.3)
SODIUM: 137 meq/L (ref 137–147)
TOTAL PROTEIN: 7.4 g/dL (ref 6.0–8.3)
Total Bilirubin: 0.3 mg/dL (ref 0.3–1.2)

## 2014-05-04 LAB — LIPASE, BLOOD: Lipase: 21 U/L (ref 11–59)

## 2014-05-04 LAB — POC URINE PREG, ED: PREG TEST UR: NEGATIVE

## 2014-05-04 MED ORDER — IOHEXOL 300 MG/ML  SOLN
100.0000 mL | Freq: Once | INTRAMUSCULAR | Status: AC | PRN
  Administered 2014-05-04: 100 mL via INTRAVENOUS

## 2014-05-04 MED ORDER — IOHEXOL 300 MG/ML  SOLN: 50.0000 mL | Freq: Once | INTRAMUSCULAR | Status: DC | PRN

## 2014-05-04 MED ORDER — HYDROCODONE-ACETAMINOPHEN 5-325 MG PO TABS: 2.0000 | ORAL_TABLET | ORAL | Status: AC | PRN

## 2014-05-04 MED ORDER — RANITIDINE HCL 150 MG PO TABS: 150.0000 mg | ORAL_TABLET | Freq: Two times a day (BID) | ORAL | Status: AC

## 2014-05-04 MED ORDER — IOHEXOL 300 MG/ML  SOLN
50.0000 mL | Freq: Once | INTRAMUSCULAR | Status: AC | PRN
  Administered 2014-05-04: 50 mL via ORAL

## 2014-05-04 MED ORDER — HYDROCODONE-ACETAMINOPHEN 5-325 MG PO TABS
2.0000 | ORAL_TABLET | Freq: Once | ORAL | Status: AC
  Administered 2014-05-04: 2 via ORAL
  Filled 2014-05-04: qty 2

## 2014-05-04 NOTE — ED Provider Notes (Signed)
CSN: [id number]     Arrival date & time 05/04/14  1713 History   First MD Initiated Contact with Patient 05/04/14 2018     Chief Complaint  Patient presents with  . Abdominal Pain     (Consider location/radiation/quality/duration/timing/severity/associated sxs/prior Treatment) HPI Comments: Patient presents to the ER for evaluation of abdominal pain. Patient reports that symptoms have been present since early this morning. Patient reports sharp, stabbing pains across her upper abdomen. Pain sometimes radiates to the lower abdomen and up into the chest. She has not identified any alleviating or exacerbating factors. Patient does report that it feels similar to when she had gallstones, but did have her gallbladder removed in 2012. She has not had any fever, nausea, vomiting, diarrhea or constipation. She denies urinary symptoms.  Patient is a 38 y.o. female presenting with abdominal pain.  Abdominal Pain   Past Medical History  Diagnosis Date  . Syncope   . Sickle cell trait    Past Surgical History  Procedure Laterality Date  . Cesarean section    . Cholecystectomy  02/17/2012    Procedure: LAPAROSCOPIC CHOLECYSTECTOMY WITH INTRAOPERATIVE CHOLANGIOGRAM;  Surgeon: Emelia Loron, MD;  Location: [facility] OR;  Service: General;  Laterality: N/A;   No family history on file. History  Substance Use Topics  . Smoking status: Never Smoker   . Smokeless tobacco: Never Used  . Alcohol Use: Yes     Comment: socially   OB History   Grav Para Term Preterm Abortions TAB SAB Ect Mult Living                 Review of Systems  Gastrointestinal: Positive for abdominal pain.  All other systems reviewed and are negative.     Allergies  Review of patient's allergies indicates no known allergies.  Home Medications   Prior to Admission medications   Medication Sig Start Date End Date Taking? Authorizing Provider  Cyanocobalamin (VITAMIN B-12 IJ) Inject as directed every 30 (thirty) days.    Yes Historical Provider, MD  cyclobenzaprine (FLEXERIL) 10 MG tablet Take 1 tablet (10 mg total) by mouth at bedtime. 10/18/13  Yes Mellody Drown, PA-C  DM-Doxylamine-Acetaminophen (VICKS NYQUIL COLD & FLU) 15-6.25-325 MG/15ML LIQD Take 30 mLs by mouth every 6 (six) hours.   Yes Historical Provider, MD  ibuprofen (ADVIL,MOTRIN) 800 MG tablet Take 1 tablet (800 mg total) by mouth 3 (three) times daily. Take with meals 10/18/13  Yes Lauren Jimmey Ralph, PA-C  phentermine (ADIPEX-P) 37.5 MG tablet Take 37.5 mg by mouth daily before breakfast.   Yes Historical Provider, MD  DHA-EPA-Vitamin E (OMEGA-3 COMPLEX) 192-251-11 MG-MG-UNIT CAPS Take 2 capsules by mouth daily.    Historical Provider, MD  HYDROcodone-acetaminophen (NORCO/VICODIN) 5-325 MG per tablet Take 2 tablets by mouth every 4 (four) hours as needed for moderate pain. 05/04/14   Gilda Crease, MD  ranitidine (ZANTAC) 150 MG tablet Take 1 tablet (150 mg total) by mouth 2 (two) times daily. 05/04/14   Gilda Crease, MD   BP 132/90  Pulse 87  Temp(Src) 98.7 F (37.1 C) (Oral)  Resp 15  SpO2 100%  LMP 05/03/2014 Physical Exam  Constitutional: She is oriented to person, place, and time. She appears well-developed and well-nourished. No distress.  HENT:  Head: Normocephalic and atraumatic.  Right Ear: Hearing normal.  Left Ear: Hearing normal.  Nose: Nose normal.  Mouth/Throat: Oropharynx is clear and moist and mucous membranes are normal.  Eyes: Conjunctivae and EOM are normal. Pupils are equal,  round, and reactive to light.  Neck: Normal range of motion. Neck supple.  Cardiovascular: Regular rhythm, S1 normal and S2 normal.  Exam reveals no gallop and no friction rub.   No murmur heard. Pulmonary/Chest: Effort normal and breath sounds normal. No respiratory distress. She exhibits no tenderness.  Abdominal: Soft. Normal appearance and bowel sounds are normal. There is no hepatosplenomegaly. There is tenderness in the right  upper quadrant, epigastric area, periumbilical area and left upper quadrant. There is no rebound, no guarding, no tenderness at McBurney's point and negative Murphy's sign. No hernia.  Musculoskeletal: Normal range of motion.  Neurological: She is alert and oriented to person, place, and time. She has normal strength. No cranial nerve deficit or sensory deficit. Coordination normal. GCS eye subscore is 4. GCS verbal subscore is 5. GCS motor subscore is 6.  Skin: Skin is warm, dry and intact. No rash noted. No cyanosis.  Psychiatric: She has a normal mood and affect. Her speech is normal and behavior is normal. Thought content normal.    ED Course  Procedures (including critical care time) Labs Review Labs Reviewed  CBC WITH DIFFERENTIAL - Abnormal; Notable for the following:    WBC 16.3 (*)    Neutrophils Relative % 82 (*)    Neutro Abs 13.3 (*)    All other components within normal limits  URINALYSIS, ROUTINE W REFLEX MICROSCOPIC - Abnormal; Notable for the following:    APPearance CLOUDY (*)    All other components within normal limits  COMPREHENSIVE METABOLIC PANEL  LIPASE, BLOOD  POC URINE PREG, ED    Imaging Review Ct Abdomen Pelvis W Contrast  05/04/2014   CLINICAL DATA:  Abdominal pain. Pain is acute. Pain feels like gallbladder pain patient all be for cholecystectomy. Pain is right-sided.  EXAM: CT ABDOMEN AND PELVIS WITH CONTRAST  TECHNIQUE: Multidetector CT imaging of the abdomen and pelvis was performed using the standard protocol following bolus administration of intravenous contrast.  CONTRAST:  100mL OMNIPAQUE IOHEXOL 300 MG/ML SOLN, 50mL OMNIPAQUE IOHEXOL 300 MG/ML SOLN  COMPARISON:  None.  FINDINGS: Mild subsegmental atelectasis at the left lung base. Heart is normal in size.  Liver and spleen are unremarkable.  Gallbladder surgically removed.  There is mild dilation of the intra and extrahepatic biliary tree. Common bile duct measures 9 mm proximally is shows normal distal  tapering. There is mild dilation pancreatic duct to 3 mm. These findings are presumed to be chronic.  Normal pancreas. No adrenal masses. The wall prominence of the intrarenal collecting systems, right greater than left. There also duplicated intrarenal collecting systems and ureters. No renal or ureteral stones. No obstructive uropathy. No renal masses. Kidneys are unremarkable.  Normal bladder. Uterus shows small areas of relative increased attenuation suggesting small fibroids. No adnexal masses.  No adenopathy.  No abnormal fluid collections.  Colon and small bowel are unremarkable. Normal appendix is visualized.  Minor degenerative changes noted of the visualized spine. No osteoblastic or osteolytic lesions.  IMPRESSION: 1. No acute findings. No findings to explain right-sided abdominal pain. Normal appendix visualized. 2. Status post cholecystectomy. Mild chronic dilation of the intra and extrahepatic biliary tree and pancreatic duct. No evidence of a duct stone. 3. 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 There is mild prominence of the intrarenal collecting systems but no convincing obstructive uropathy. No renal or ureteral stones. 4. Probable small uterine fibroids.   Electronically Signed   By: Amie Portlandavid  Ormond M.D.   On: 05/04/2014 22:44  EKG Interpretation   Date/Time:  Monday May 04 2014 21:37:49 EDT Ventricular Rate:  70 PR Interval:  167 QRS Duration: 78 QT Interval:  407 QTC Calculation: 439 R Axis:   51 Text Interpretation:  Sinus rhythm Baseline wander in lead(s) V3 Otherwise  within normal limits Confirmed by Rehema Muffley  MD, Gresham Caetano [phone]([id number]) on  05/04/2014 9:43:00 PM      MDM   Final diagnoses:  RLQ abdominal pain  RUQ abdominal pain   Patient presented to the ER for evaluation of abdominal pain. Patient had pain in the upper and lower portions of the abdomen. Patient's exam did not show any focal tenderness, no guarding or rebound. Lab work was  unremarkable. Patient underwent CT scan to further evaluate for the possibility of appendicitis, diverticulitis. She is status post cholecystectomy. CAT scan did not show any acute abnormality. Patient treated symptomatically.    Gilda Creasehristopher J. Lehi Phifer, MD 05/05/14 0030
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
## 2014-05-04 NOTE — Discharge Instructions (Signed)
Abdominal Pain °Many things can cause abdominal pain. Usually, abdominal pain is not caused by a disease and will improve without treatment. It can often be observed and treated at home. Your health care provider will do a physical exam and possibly order blood tests and X-rays to help determine the seriousness of your pain. However, in many cases, more time must pass before a clear cause of the pain can be found. Before that point, your health care provider may not know if you need more testing or further treatment. °HOME CARE INSTRUCTIONS  °Monitor your abdominal pain for any changes. The following actions may help to alleviate any discomfort you are experiencing: °· Only take over-the-counter or prescription medicines as directed by your health care provider. °· Do not take laxatives unless directed to do so by your health care provider. °· Try a clear liquid diet (broth, tea, or water) as directed by your health care provider. Slowly move to a bland diet as tolerated. °SEEK MEDICAL CARE IF: °· You have unexplained abdominal pain. °· You have abdominal pain associated with nausea or diarrhea. °· You have pain when you urinate or have a bowel movement. °· You experience abdominal pain that wakes you in the night. °· You have abdominal pain that is worsened or improved by eating food. °· You have abdominal pain that is worsened with eating fatty foods. °· You have a fever. °SEEK IMMEDIATE MEDICAL CARE IF:  °· Your pain does not go away within 2 hours. °· You keep throwing up (vomiting). °· Your pain is felt only in portions of the abdomen, such as the right side or the left lower portion of the abdomen. °· You pass bloody or black tarry stools. °MAKE SURE YOU: °· Understand these instructions.   °· Will watch your condition.   °· Will get help right away if you are not doing well or get worse.   °Document Released: 04/05/2005 Document Revised: 07/01/2013 Document Reviewed: 03/05/2013 °ExitCare® Patient Information  ©2015 [employer]. This information is not intended to replace advice given to you by your health care provider. Make sure you discuss any questions you have with your health care provider. ° ° °Emergency Department Resource Guide °1) Find a Doctor and Pay Out of Pocket °Although you won't have to find out who is covered by your insurance plan, it is a good idea to ask around and get recommendations. You will then need to call the office and see if the doctor you have chosen will accept you as a new patient and what types of options they offer for patients who are self-pay. Some doctors offer discounts or will set up payment plans for their patients who do not have insurance, but you will need to ask so you aren't surprised when you get to your appointment. ° °2) Contact Your Local Health Department °Not all health departments have doctors that can see patients for sick visits, but many do, so it is worth a call to see if yours does. If you don't know where your local health department is, you can check in your phone book. The CDC also has a tool to help you locate your state's health department, and many state websites also have listings of all of their local health departments. ° °3) Find a Walk-in Clinic °If your illness is not likely to be very severe or complicated, you may want to try a walk in clinic. These are popping up all over the country in pharmacies, drugstores, and shopping centers. They're   usually staffed by nurse practitioners or physician assistants that have been trained to treat common illnesses and complaints. They're usually fairly quick and inexpensive. However, if you have serious medical issues or chronic medical problems, these are probably not your best option. ° °No Primary Care Doctor: °- Call [facility] at  832-8000 - they can help you locate a primary care doctor that  accepts your insurance, provides certain services, etc. °- Physician Referral Service- 1-800-533-3463 ° °Chronic  Pain Problems: °Organization         Address  Phone   Notes  °[facility]  (336) 297-2271 Patients need to be referred by their primary care doctor.  ° °Medication Assistance: °Organization         Address  Phone   Notes  °[place] [facility] [address] °[place], [place] [address] (336) 641-8030 --Must be a resident of [place] °-- Must have NO insurance coverage whatsoever (no Medicaid/ Medicare, etc.) °-- The pt. MUST have a primary care doctor that directs their care regularly and follows them in the community °  °[facility]  (866) 331-1348   °[employer]  (888) 892-1162   ° °Agencies that provide inexpensive medical care: °Organization         Address  Phone   Notes  °[facility]  (336) 832-8035   °[facility]    (336) 832-7272   °[facility] [address] °[place], [place] [address] (336) 832-4777   °[facility] [address], °[place] (336) 271-4999   °[facility]    (336) 373-0678   °[facility]    (336) 272-1050   °[facility] ° [address], [place] Phone:  (336) 832-4444, Fax:  (336) 832-4440 Hours of Operation:  9 am - 6 pm, M-F.  Also accepts Medicaid/Medicare and self-pay.  °[facility] ° [address], [place] Phone: (336) 832-3150, Fax: (336) 832-3151. Hours of Operation:  8:30 am - 5:30 pm, M-F.  Also accepts Medicaid and self-pay.  °[facility] [place] [address], [place] Phone: (336) 878-6027   °[facility] [address], [place], [place] (336)723-1848, Ext. 123 Mondays & Thursdays: 7-9 AM.  First 15 patients are seen on a first come, first serve basis. °  ° °Medicaid-accepting [place] Providers: ° °Organization         Address  Phone   Notes  °[facility] [address], [place] (336) 641-2100 Also  accepts self-pay patients.  °[facility] [address], [place] ° (336) 856-9996   °[facility] [address], [place] (336) 288-8857   °[facility] [address] [place] [address], [place] (336) 299-7000   °Veita Bland [address], [place]  ° (336) 373-1557 Only accepts [place] Access Medicaid patients after they have their name applied to their card.  ° °Self-Pay (no insurance) in [place]: ° °Organization         Address  Phone   Notes  °Sickle Cell Patients, [facility] [address], [place] (336) 832-1970   °[facility] [address],  (336) 832-4400   ° [facility] ° [address], [place] (336) 992-4800   °[facility]/Dr. Osei-Bonsu ° [address] [place] [address], [place] or [address], [place] (336) [phone] Phone number for both [place] and [place] locations is the same.  °[facility] [address], [place] (336) 299-0000   °[facility] [address] [place] [address], [place] or [address] (336) 852-7530 °(336) 878-2260   °[facility] [address], [place] (336) 350-1642, phone; (336) 294-5005, fax Sees patients 1st and 3rd Saturday of every month.  Must not qualify for public or private insurance (i.e. Medicaid, Medicare, [place] Health Choice, Veterans' Benefits) • Household income should be no more than 200% of the poverty level •The clinic cannot treat you if you are pregnant or think you are pregnant • Sexually transmitted diseases are not treated at the clinic.  ° ° °Dental Care: °Organization         Address  Phone  Notes  °[facility] [address], [place] (336) 641-6152 Accepts children up to age 21 who are enrolled in Medicaid or [place] Health Choice; pregnant  women with a Medicaid card; and children who have applied for Medicaid or [place] Health Choice, but were declined, whose parents can pay a reduced fee at time of service.  °[facility] [place]  [address], [place] (336) 641-7733 Accepts children up to age 21 who are enrolled in Medicaid or [place] Health Choice; pregnant women with a Medicaid card; and children who have applied for Medicaid or [place] Health Choice, but were declined, whose parents can pay a reduced fee at time of service.  °[facility] ° [address], [place] (336) 641-4533 Patients are seen by appointment only. Walk-ins are not accepted. [facility] will see patients 18 years of age and older. °Monday - Tuesday (8am-5pm) °Most Wednesdays (8:30-5pm) °$30 per visit, cash only  °[facility] ° [address], [place] (336) 641-4533 Patients are seen by appointment only. Walk-ins are not accepted. [facility] will see patients 18 years of age and older. °One Wednesday Evening (Monthly: Volunteer Based).  $30 per visit, cash only  °[facility]  (919) 537-3737 for adults; Children under age 4, call Graduate Pediatric Dentistry at (919) [phone]. Children aged 4-14, please call (919) [phone] to request a pediatric application. ° Dental services are provided in all areas of dental care including fillings, crowns and bridges, complete and partial dentures, implants, gum treatment, root canals, and extractions. Preventive care is also provided. Treatment is provided to both adults and children. °Patients are selected via a lottery and there is often a waiting list. °  °[facility] [address], °[place] ° (336) 763-8833 [email or web address] °  °[facility] [address], [place], [place] (336)723-1848, Ext. 123 Second and Fourth Thursday of each month, opens at 6:30 AM; Clinic ends at 9 AM.  Patients are  seen on a first-come first-served basis, and a limited number are seen during each clinic.  ° °[facility] ° [address], [place], [place] (336) 723-7904   Eligibility Requirements °You must have lived in [place], [place], or [place] counties for at least the last three months. °  You cannot be eligible for state or federal sponsored healthcare insurance, including Veterans Administration, Medicaid, or Medicare. °  You generally cannot be eligible for healthcare insurance through   your employer.  °  How to apply: °Eligibility screenings are held every Tuesday and Wednesday afternoon from 1:00 pm until 4:00 pm. You do not need an appointment for the interview!  °[facility] [address], [place], [place] 336-631-2330   °[facility]  336-342-8273   °[facility]  336-703-3100   °[facility]  336-570-6415   ° °Behavioral Health Resources in the Community: °Intensive Outpatient Programs °Organization         Address  Phone  Notes  °[facility] [address], [place], [place] 336-878-6098   °[facility] [address], [place], [place] 336-832-9800   °[facility] [address], [place], [place] ° 336-882-2125   °[facility] [address],  °[place], [place] 1-800-853-5163 or 336-641-4981   °Substance Abuse Resources °Organization         Address  Phone  Notes  °[facility]  336-882-2125   °[facility]  336-784-9470   °[facility]  336-285-9073   °[facility]  336-845-3988   °Residential & Outpatient Substance Abuse Program  1-800-659-3381   °Psychological Services °Organization         Address  Phone  Notes  °[facility]  336- 832-9600   °[facility]  336- 378-7881   °[facility] [address], [place] 1-800-853-5163 or 336-641-4981   ° °Mobile Crisis  Teams °Organization         Address  Phone  Notes  °[facility], Mobile Crisis Care Unit  1-877-626-1772   °Assertive °[facility] ° [address] [place], [place] 336-834-9664   °Sharon DeEsch [address] °[place] 336-554-5454   ° °Self-Help/Support Groups °Organization         Address  Phone             Notes  °[facility] - variety of support groups  336- 373-1402 Call for more information  °Narcotics Anonymous (NA), [facility] [address], °[place]  2 meetings at this location  ° °Residential Treatment Programs °Organization         Address  Phone  Notes  °[facility] [address],    °[place]  1-866-801-8205   °[facility] ° [address], [place], [place] 704-293-8524   °[facility] [address], [place] 336-845-3988 Admissions: 8am-3pm M-F  °[facility] [address]    °[place], [place] 336-841-1104   °[facility] [address], [place], [place] 336-379-7146   °[facility] [address]  °[place], [place] 336-285-9073   °[facility] Intensive Outpatient [address], [place], [place] 336-852-3033   °[facility] [address]  °[place], [place] 1-877-615-2722 or 336-784-9470   °[facility] [address] [place], [place] 336-227-7417 Accepts Medicaid  °[facility] [address]  ° [place] 1-800-659-3381 Substance Abuse/Addiction Treatment  ° °[place] Behavioral Health Resources °Organization         Address  Phone  Notes  °[facility]  (888) 581-9988   °Julie Brannon, PhD [address] [place], [place]   (336) 349-5553 or (336) 951-0000   °[facility]   [address] °[place],  (336) 349-4454   °  [facility] [address], [place], [place] (336) 342-8316  Insurance/Medicaid/sponsorship through [facility]  °[facility] [address]                                    [place], [place] (336) 342-8316 Therapy/tele-psych/case  °[facility] [address]  ° [place], [place] (336) 349-2233    °Dr. Arfeen  (336) 349-4544   °[facility] [place]  [employer] [place] [facility] 1) [address], [place] °2) [address], [place] °3)  [address], [place] (336) 349-3220 °(336) 342-7768 ° °(336) 342-8140   °[place] [facility] (336) 342-1394 or (336) 342-3537 (After Hours)    ° ° ° ° °

## 2014-05-04 NOTE — ED Notes (Signed)
Unsuccessfully attempted to obtain blood for labs.RN made aware 

## 2014-05-04 NOTE — Progress Notes (Signed)
  CARE MANAGEMENT ED NOTE 05/04/2014  Patient:  Dois DavenportCAMPBELL,Audriella L   Account Number:  [id number]  Date Initiated:  05/04/2014  Documentation initiated by:  Radford PaxFERRERO,Donalda Job  Subjective/Objective Assessment:   patient presents to Ed with abdominal pain     Subjective/Objective Assessment Detail:     Action/Plan:   Action/Plan Detail:   Anticipated DC Date:       Status Recommendation to Physician:   Result of Recommendation:    Other ED Services  Consult Working Plan    DC Planning Services  Other  PCP issues    Choice offered to / List presented to:            Status of service:  Completed, signed off  ED Comments:   ED Comments Detail:  Patient confirms he pcp is located at [facility] on [address] System updated.

## 2014-05-04 NOTE — ED Notes (Signed)
Patient requesting some pain medicine before leaving

## 2014-06-14 ENCOUNTER — Emergency Department (HOSPITAL_COMMUNITY): Payer: Medicaid Other

## 2014-06-14 ENCOUNTER — Encounter (HOSPITAL_COMMUNITY): Payer: Self-pay | Admitting: Emergency Medicine

## 2014-06-14 ENCOUNTER — Emergency Department (HOSPITAL_COMMUNITY)
Admission: EM | Admit: 2014-06-14 | Discharge: 2014-06-14 | Disposition: A | Discharge status: Home / Self Care | Payer: Medicaid Other | Attending: Emergency Medicine | Admitting: Emergency Medicine

## 2014-06-14 DIAGNOSIS — S70212A Abrasion, left hip, initial encounter: Secondary | ICD-10-CM | POA: Insufficient documentation

## 2014-06-14 DIAGNOSIS — T07XXXA Unspecified multiple injuries, initial encounter: Secondary | ICD-10-CM

## 2014-06-14 DIAGNOSIS — S3991XA Unspecified injury of abdomen, initial encounter: Secondary | ICD-10-CM | POA: Diagnosis not present

## 2014-06-14 DIAGNOSIS — Z3202 Encounter for pregnancy test, result negative: Secondary | ICD-10-CM | POA: Insufficient documentation

## 2014-06-14 DIAGNOSIS — D5700 Hb-SS disease with crisis, unspecified: Secondary | ICD-10-CM | POA: Diagnosis not present

## 2014-06-14 DIAGNOSIS — Y9389 Activity, other specified: Secondary | ICD-10-CM | POA: Diagnosis not present

## 2014-06-14 DIAGNOSIS — M79673 Pain in unspecified foot: Secondary | ICD-10-CM

## 2014-06-14 DIAGNOSIS — Y92481 Parking lot as the place of occurrence of the external cause: Secondary | ICD-10-CM | POA: Insufficient documentation

## 2014-06-14 DIAGNOSIS — Y998 Other external cause status: Secondary | ICD-10-CM | POA: Insufficient documentation

## 2014-06-14 DIAGNOSIS — S99922A Unspecified injury of left foot, initial encounter: Secondary | ICD-10-CM | POA: Diagnosis present

## 2014-06-14 DIAGNOSIS — Z79899 Other long term (current) drug therapy: Secondary | ICD-10-CM | POA: Diagnosis not present

## 2014-06-14 DIAGNOSIS — T07 Unspecified multiple injuries: Secondary | ICD-10-CM | POA: Insufficient documentation

## 2014-06-14 LAB — URINALYSIS, ROUTINE W REFLEX MICROSCOPIC
BILIRUBIN URINE: NEGATIVE
Glucose, UA: NEGATIVE mg/dL
Ketones, ur: NEGATIVE mg/dL
Leukocytes, UA: NEGATIVE
NITRITE: NEGATIVE
PROTEIN: NEGATIVE mg/dL
Specific Gravity, Urine: 1.007 (ref 1.005–1.030)
UROBILINOGEN UA: 0.2 mg/dL (ref 0.0–1.0)
pH: 7.5 (ref 5.0–8.0)

## 2014-06-14 LAB — COMPREHENSIVE METABOLIC PANEL
ALT: 17 U/L (ref 0–35)
ANION GAP: 12 (ref 5–15)
AST: 18 U/L (ref 0–37)
Albumin: 3.8 g/dL (ref 3.5–5.2)
Alkaline Phosphatase: 77 U/L (ref 39–117)
BUN: 12 mg/dL (ref 6–23)
CHLORIDE: 102 meq/L (ref 96–112)
CO2: 23 mEq/L (ref 19–32)
CREATININE: 0.71 mg/dL (ref 0.50–1.10)
Calcium: 9.3 mg/dL (ref 8.4–10.5)
GFR calc Af Amer: >90 mL/min (ref >90)
GFR calc non Af Amer: >90 mL/min (ref >90)
GLUCOSE: 111 mg/dL — AB (ref 70–99)
Potassium: 4.3 mEq/L (ref 3.7–5.3)
Sodium: 137 mEq/L (ref 137–147)
Total Bilirubin: 0.2 mg/dL — ABNORMAL LOW (ref 0.3–1.2)
Total Protein: 7 g/dL (ref 6.0–8.3)

## 2014-06-14 LAB — CBC WITH DIFFERENTIAL/PLATELET
BASOS ABS: 0.1 10*3/uL (ref 0.0–0.1)
BASOS PCT: 1 % (ref 0–1)
EOS PCT: 1 % (ref 0–5)
Eosinophils Absolute: 0.1 10*3/uL (ref 0.0–0.7)
HCT: 35.8 % — ABNORMAL LOW (ref 36.0–46.0)
Hemoglobin: 11.5 g/dL — ABNORMAL LOW (ref 12.0–15.0)
LYMPHS PCT: 21 % (ref 12–46)
Lymphs Abs: 1.6 10*3/uL (ref 0.7–4.0)
MCH: 26.6 pg (ref 26.0–34.0)
MCHC: 32.1 g/dL (ref 30.0–36.0)
MCV: 82.7 fL (ref 78.0–100.0)
Monocytes Absolute: 0.4 10*3/uL (ref 0.1–1.0)
Monocytes Relative: 5 % (ref 3–12)
Neutro Abs: 5.4 10*3/uL (ref 1.7–7.7)
Neutrophils Relative %: 72 % (ref 43–77)
Platelets: 233 10*3/uL (ref 150–400)
RBC: 4.33 MIL/uL (ref 3.87–5.11)
RDW: 13.5 % (ref 11.5–15.5)
WBC: 7.5 10*3/uL (ref 4.0–10.5)

## 2014-06-14 LAB — URINE MICROSCOPIC-ADD ON

## 2014-06-14 LAB — POC URINE PREG, ED: Preg Test, Ur: NEGATIVE

## 2014-06-14 MED ORDER — IOHEXOL 300 MG/ML  SOLN
100.0000 mL | Freq: Once | INTRAMUSCULAR | Status: AC | PRN
  Administered 2014-06-14: 100 mL via INTRAVENOUS

## 2014-06-14 MED ORDER — OXYCODONE-ACETAMINOPHEN 5-325 MG PO TABS
1.0000 | ORAL_TABLET | Freq: Once | ORAL | Status: AC
  Administered 2014-06-14: 1 via ORAL
  Filled 2014-06-14: qty 1

## 2014-06-14 NOTE — ED Notes (Signed)
Dr Littie DeedsGentry in to see patient

## 2014-06-14 NOTE — Discharge Instructions (Signed)
Use ibuprofen or acetaminophen as needed for pain control. Use ice on the sore spots 3 times a day for 2 days after that, use heat.     Contusion A contusion is a deep bruise. Contusions are the result of an injury that caused bleeding under the skin. The contusion may turn blue, purple, or yellow. Minor injuries will give you a painless contusion, but more severe contusions may stay painful and swollen for a few weeks.  CAUSES  A contusion is usually caused by a blow, trauma, or direct force to an area of the body. SYMPTOMS   Swelling and redness of the injured area.  Bruising of the injured area.  Tenderness and soreness of the injured area.  Pain. DIAGNOSIS  The diagnosis can be made by taking a history and physical exam. An X-ray, CT scan, or MRI may be needed to determine if there were any associated injuries, such as fractures. TREATMENT  Specific treatment will depend on what area of the body was injured. In general, the best treatment for a contusion is resting, icing, elevating, and applying cold compresses to the injured area. Over-the-counter medicines may also be recommended for pain control. Ask your caregiver what the best treatment is for your contusion. HOME CARE INSTRUCTIONS   Put ice on the injured area.  Put ice in a plastic bag.  Place a towel between your skin and the bag.  Leave the ice on for 15-20 minutes, 3-4 times a day, or as directed by your health care provider.  Only take over-the-counter or prescription medicines for pain, discomfort, or fever as directed by your caregiver. Your caregiver may recommend avoiding anti-inflammatory medicines (aspirin, ibuprofen, and naproxen) for 48 hours because these medicines may increase bruising.  Rest the injured area.  If possible, elevate the injured area to reduce swelling. SEEK IMMEDIATE MEDICAL CARE IF:   You have increased bruising or swelling.  You have pain that is getting worse.  Your swelling or  pain is not relieved with medicines. MAKE SURE YOU:   Understand these instructions.  Will watch your condition.  Will get help right away if you are not doing well or get worse. Document Released: 04/05/2005 Document Revised: 07/01/2013 Document Reviewed: 05/01/2011 [facility] Patient Information 2015 [place], [place]. This information is not intended to replace advice given to you by your health care provider. Make sure you discuss any questions you have with your health care provider.
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
## 2014-06-14 NOTE — ED Provider Notes (Signed)
She returned from x-ray.  Films were negative.  I informed patient.  She had no further questions or concerns.  Flint MelterElliott L Bailynn Dyk, MD 06/14/14 [phone]
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
## 2021-06-23 ENCOUNTER — Emergency Department (HOSPITAL_BASED_OUTPATIENT_CLINIC_OR_DEPARTMENT_OTHER)
Admission: EM | Admit: 2021-06-23 | Discharge: 2021-06-23 | Disposition: A | Discharge status: Home / Self Care | Payer: 59 | Attending: Emergency Medicine | Admitting: Emergency Medicine

## 2021-06-23 ENCOUNTER — Encounter (HOSPITAL_BASED_OUTPATIENT_CLINIC_OR_DEPARTMENT_OTHER): Payer: Self-pay

## 2021-06-23 ENCOUNTER — Emergency Department (HOSPITAL_BASED_OUTPATIENT_CLINIC_OR_DEPARTMENT_OTHER): Payer: 59 | Admitting: Radiology

## 2021-06-23 ENCOUNTER — Other Ambulatory Visit: Payer: Self-pay

## 2021-06-23 ENCOUNTER — Emergency Department (HOSPITAL_BASED_OUTPATIENT_CLINIC_OR_DEPARTMENT_OTHER): Payer: 59

## 2021-06-23 DIAGNOSIS — Y92410 Unspecified street and highway as the place of occurrence of the external cause: Secondary | ICD-10-CM | POA: Insufficient documentation

## 2021-06-23 DIAGNOSIS — R519 Headache, unspecified: Secondary | ICD-10-CM | POA: Diagnosis not present

## 2021-06-23 DIAGNOSIS — S4992XA Unspecified injury of left shoulder and upper arm, initial encounter: Secondary | ICD-10-CM | POA: Insufficient documentation

## 2021-06-23 MED ORDER — METHOCARBAMOL 500 MG PO TABS: 500.0000 mg | ORAL_TABLET | Freq: Two times a day (BID) | ORAL | 0 refills | Status: AC
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
## 2021-06-23 NOTE — Discharge Instructions (Signed)
Your work-up today including CT scan of your head, CT scan of your neck, left shoulder are all normal.  I have sent in a muscle relaxer to your pharmacy.  Continue taking Tylenol or Motrin for your muscle aches and soreness.  If your symptoms worsen please return to the emergency room.  We discussed setting up a primary care provider.  There is a phone number on your discharge paperwork you can call to get assistance with setting up PCP.
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
